# Patient Record
Sex: Female | Born: 1950 | Race: Black or African American | Hispanic: No | Marital: Single | Smoking: Current every day smoker
Health system: Southern US, Community
[De-identification: ages and names within clinical notes are randomized; demographics above are authoritative.]

## PROBLEM LIST (undated history)

## (undated) DIAGNOSIS — J302 Other seasonal allergic rhinitis: Secondary | ICD-10-CM

## (undated) DIAGNOSIS — E785 Hyperlipidemia, unspecified: Secondary | ICD-10-CM

## (undated) DIAGNOSIS — F329 Major depressive disorder, single episode, unspecified: Secondary | ICD-10-CM

## (undated) DIAGNOSIS — F419 Anxiety disorder, unspecified: Secondary | ICD-10-CM

## (undated) DIAGNOSIS — T7840XA Allergy, unspecified, initial encounter: Secondary | ICD-10-CM

## (undated) DIAGNOSIS — F32A Depression, unspecified: Secondary | ICD-10-CM

## (undated) DIAGNOSIS — R51 Headache: Secondary | ICD-10-CM

## (undated) HISTORY — DX: Allergy, unspecified, initial encounter: T78.40XA

## (undated) HISTORY — DX: Hyperlipidemia, unspecified: E78.5

## (undated) HISTORY — PX: TOOTH EXTRACTION: SUR596

---

## 1997-09-17 HISTORY — PX: FOOT FRACTURE SURGERY: SHX645

## 1998-04-26 ENCOUNTER — Emergency Department (HOSPITAL_COMMUNITY): Admission: EM | Admit: 1998-04-26 | Discharge: 1998-04-26 | Payer: Self-pay | Admitting: Emergency Medicine

## 2000-04-04 ENCOUNTER — Emergency Department (HOSPITAL_COMMUNITY): Admission: EM | Admit: 2000-04-04 | Discharge: 2000-04-04 | Payer: Self-pay | Admitting: Emergency Medicine

## 2001-01-24 ENCOUNTER — Emergency Department (HOSPITAL_COMMUNITY): Admission: AC | Admit: 2001-01-24 | Discharge: 2001-01-25 | Payer: Self-pay

## 2001-01-24 ENCOUNTER — Encounter: Payer: Self-pay | Admitting: Emergency Medicine

## 2001-01-25 ENCOUNTER — Encounter: Payer: Self-pay | Admitting: Emergency Medicine

## 2001-02-05 ENCOUNTER — Encounter: Payer: Self-pay | Admitting: Sports Medicine

## 2001-02-05 ENCOUNTER — Ambulatory Visit (HOSPITAL_COMMUNITY): Admission: RE | Admit: 2001-02-05 | Discharge: 2001-02-05 | Payer: Self-pay | Admitting: Sports Medicine

## 2001-02-11 ENCOUNTER — Emergency Department (HOSPITAL_COMMUNITY): Admission: EM | Admit: 2001-02-11 | Discharge: 2001-02-11 | Payer: Self-pay | Admitting: Emergency Medicine

## 2001-02-19 ENCOUNTER — Ambulatory Visit (HOSPITAL_BASED_OUTPATIENT_CLINIC_OR_DEPARTMENT_OTHER): Admission: RE | Admit: 2001-02-19 | Discharge: 2001-02-19 | Payer: Self-pay | Admitting: Orthopedic Surgery

## 2002-04-30 ENCOUNTER — Encounter: Payer: Self-pay | Admitting: Emergency Medicine

## 2002-04-30 ENCOUNTER — Emergency Department (HOSPITAL_COMMUNITY): Admission: EM | Admit: 2002-04-30 | Discharge: 2002-04-30 | Payer: Self-pay | Admitting: Unknown Physician Specialty

## 2002-06-06 ENCOUNTER — Emergency Department (HOSPITAL_COMMUNITY): Admission: EM | Admit: 2002-06-06 | Discharge: 2002-06-06 | Payer: Self-pay | Admitting: Emergency Medicine

## 2003-07-09 ENCOUNTER — Emergency Department (HOSPITAL_COMMUNITY): Admission: EM | Admit: 2003-07-09 | Discharge: 2003-07-09 | Payer: Self-pay | Admitting: Emergency Medicine

## 2008-01-06 ENCOUNTER — Ambulatory Visit: Payer: Self-pay | Admitting: Internal Medicine

## 2008-01-06 ENCOUNTER — Encounter (INDEPENDENT_AMBULATORY_CARE_PROVIDER_SITE_OTHER): Payer: Self-pay | Admitting: Family Medicine

## 2008-01-06 LAB — CONVERTED CEMR LAB
ALT: 11 units/L (ref 0–35)
AST: 17 units/L (ref 0–37)
Albumin: 5.1 g/dL (ref 3.5–5.2)
Alkaline Phosphatase: 97 units/L (ref 39–117)
BUN: 10 mg/dL (ref 6–23)
Basophils Absolute: 0 10*3/uL (ref 0.0–0.1)
Basophils Relative: 0 % (ref 0–1)
CO2: 26 meq/L (ref 19–32)
Calcium: 10.7 mg/dL — ABNORMAL HIGH (ref 8.4–10.5)
Chloride: 102 meq/L (ref 96–112)
Cholesterol: 266 mg/dL — ABNORMAL HIGH (ref 0–200)
Creatinine, Ser: 0.6 mg/dL (ref 0.40–1.20)
Eosinophils Absolute: 0.2 10*3/uL (ref 0.0–0.7)
Eosinophils Relative: 2 % (ref 0–5)
Glucose, Bld: 90 mg/dL (ref 70–99)
HCT: 40.1 % (ref 36.0–46.0)
HDL: 107 mg/dL (ref >39)
Hemoglobin: 13.2 g/dL (ref 12.0–15.0)
LDL Cholesterol: 124 mg/dL — ABNORMAL HIGH (ref 0–99)
Lymphocytes Relative: 37 % (ref 12–46)
Lymphs Abs: 3.7 10*3/uL (ref 0.7–4.0)
MCHC: 32.9 g/dL (ref 30.0–36.0)
MCV: 87.9 fL (ref 78.0–100.0)
Monocytes Absolute: 0.6 10*3/uL (ref 0.1–1.0)
Monocytes Relative: 6 % (ref 3–12)
Neutro Abs: 5.5 10*3/uL (ref 1.7–7.7)
Neutrophils Relative %: 55 % (ref 43–77)
Platelets: 385 10*3/uL (ref 150–400)
Potassium: 5.1 meq/L (ref 3.5–5.3)
RBC: 4.56 M/uL (ref 3.87–5.11)
RDW: 14.3 % (ref 11.5–15.5)
Sodium: 140 meq/L (ref 135–145)
TSH: 1.849 microintl units/mL (ref 0.350–5.50)
Total Bilirubin: 0.4 mg/dL (ref 0.3–1.2)
Total CHOL/HDL Ratio: 2.5
Total Protein: 8.5 g/dL — ABNORMAL HIGH (ref 6.0–8.3)
Triglycerides: 174 mg/dL — ABNORMAL HIGH (ref <150)
VLDL: 35 mg/dL (ref 0–40)
WBC: 9.9 10*3/uL (ref 4.0–10.5)

## 2008-01-08 ENCOUNTER — Ambulatory Visit: Payer: Self-pay | Admitting: *Deleted

## 2008-04-02 ENCOUNTER — Encounter (INDEPENDENT_AMBULATORY_CARE_PROVIDER_SITE_OTHER): Payer: Self-pay | Admitting: Family Medicine

## 2008-04-02 ENCOUNTER — Ambulatory Visit: Payer: Self-pay | Admitting: Internal Medicine

## 2008-04-02 LAB — CONVERTED CEMR LAB
Chlamydia, DNA Probe: NEGATIVE
GC Probe Amp, Genital: NEGATIVE

## 2008-09-13 ENCOUNTER — Ambulatory Visit: Payer: Self-pay | Admitting: Family Medicine

## 2011-02-02 NOTE — Op Note (Signed)
[place]. [facility]  Patient:    Maria Friedman, Maria Friedman                        MRN: [id number] Proc. Date: 02/19/01 Adm. Date:  [id number] Attending:  Colbert Ewing                           Operative Report  PREOPERATIVE DIAGNOSIS:  Lisfranc fracture dislocation with diastasis, first and second cuneiform, right foot.  Impact fracture navicular at navicular first cuneiform joint.  POSTOPERATIVE DIAGNOSIS:  Lisfranc fracture dislocation with diastasis, first and second cuneiform, right foot.  Impact fracture navicular at navicular first cuneiform joint.  OPERATION PERFORMED:  Attempted closed reduction followed by open reduction internal fixation of Lisfranc fracture dislocation with interfragmentary screws first and second cuneiform x2.  SURGEON:  Loreta Ave, M.D.  ASSISTANT:  Arlys John D. Petrarca, P.A.-C.  ANESTHESIA:  General.  ESTIMATED BLOOD LOSS:  Minimal.  SPECIMENS:  None.  CULTURES:  None.  COMPLICATIONS:  None.  DRESSING:  Soft compressive with cam walker.  DESCRIPTION OF PROCEDURE:  The patient was brought to the operating room and placed on the operating table in supine position.  After adequate anesthesia had been obtained, we used fluoroscopy and manipulation to see if we could reduce her Lisfranc divergent fracture dislocation.  Given the chronicity of the injury, this was not able to be reduced to a good anatomic position closed.  Tourniquet applied, prepped and draped in the usual sterile fashion. Exsanguinated with elevation and Esmarch.  Tourniquet inflated to 250 mmHg. Longitudinal incision over the first second cuneiform interval dorsal aspect of the foot.  Skin and subcutaneous tissues divided.  Sensory nerve and extensor tendon protected and retracted.  area of injury identified.  Debris cleared out between the first and second cuneiform, first and second metatarsal shafts and also at the cuneiform navicular joint.   Once this was all cleared, I was able to reduce the first metatarsal, first cuneiform unit back up into anatomic position, bringing it out of the impaction on the navicular and being able to bring it over against the second cuneiform locking the second metatarsal into a good keyed position.  Alignment was confirmed anatomic by visual and fluoroscopic means.  A small longitudinal incision was made on the medial aspect first cuneiform.  The small guide wires for the 4.0 lag screws were then used to  ____________ the first cuneiform into the second cuneiform.  I predrilled, measured and then securely fixed.  I had to use washers on the screws so they would not penetrate the medial border of the first cuneiform.  Once these had been placed and had been countersunk down into place along the medial border first cuneiform with the threads in the second cuneiform, it locked the entire injury into a good position.  This restored normal anatomic relation of the navicular first cuneiform joint as well as the first and second cuneiform joint.  Overall alignment was excellent with good solid stable fixation.  This was confirmed fluoroscopically. Wound irrigated, injected with Marcaine without epinephrine and then closed with nylon.  Sterile compressive dressing and cam walker applied after tourniquet inflated.  Anesthesia reversed.  Brought to recovery room.  Tolerated surgery well.  No complications. DD:  02/20/01 TD:  02/20/01 Job: [id number]

## 2012-06-30 ENCOUNTER — Other Ambulatory Visit: Payer: Self-pay | Admitting: Internal Medicine

## 2012-06-30 DIAGNOSIS — Z1231 Encounter for screening mammogram for malignant neoplasm of breast: Secondary | ICD-10-CM

## 2012-07-08 ENCOUNTER — Other Ambulatory Visit: Payer: Self-pay | Admitting: Internal Medicine

## 2012-07-08 DIAGNOSIS — M810 Age-related osteoporosis without current pathological fracture: Secondary | ICD-10-CM

## 2012-07-25 ENCOUNTER — Ambulatory Visit: Payer: Self-pay

## 2012-11-28 ENCOUNTER — Encounter (HOSPITAL_COMMUNITY): Payer: Self-pay | Admitting: Emergency Medicine

## 2012-11-28 ENCOUNTER — Emergency Department (HOSPITAL_COMMUNITY)
Admission: EM | Admit: 2012-11-28 | Discharge: 2012-11-28 | Disposition: A | Discharge status: Home / Self Care | Payer: Medicaid Other | Source: Home / Self Care | Attending: Emergency Medicine | Admitting: Emergency Medicine

## 2012-11-28 DIAGNOSIS — J342 Deviated nasal septum: Secondary | ICD-10-CM

## 2012-11-28 DIAGNOSIS — R51 Headache: Secondary | ICD-10-CM

## 2012-11-28 DIAGNOSIS — J309 Allergic rhinitis, unspecified: Secondary | ICD-10-CM

## 2012-11-28 HISTORY — DX: Other seasonal allergic rhinitis: J30.2

## 2012-11-28 MED ORDER — FEXOFENADINE-PSEUDOEPHED ER 60-120 MG PO TB12: 1.0000 | ORAL_TABLET | Freq: Two times a day (BID) | ORAL | Status: DC

## 2012-11-28 MED ORDER — NAPROXEN 500 MG PO TABS: 500.0000 mg | ORAL_TABLET | Freq: Two times a day (BID) | ORAL | Status: DC

## 2012-11-28 MED ORDER — FLUTICASONE PROPIONATE 50 MCG/ACT NA SUSP: 2.0000 | Freq: Every day | NASAL | Status: DC

## 2012-11-28 MED ORDER — GABAPENTIN 300 MG PO CAPS: ORAL_CAPSULE | ORAL | Status: DC

## 2012-11-28 NOTE — ED Notes (Signed)
C/o headache pain behind her R eye, R temple and R ear onset last Wednesday for 3-4 hrs.  Pain came back today while talking to her daughter @ 1330.  When she has a disagreement with someone it triggers a headache.  No nausea

## 2012-11-28 NOTE — ED Provider Notes (Signed)
Chief Complaint  Patient presents with  . Headache    History of Present Illness:   Maria Friedman is a 62 year old female who presents with a 3-4 year history of recurring headaches. These occur about once a week and lasts from 3 or 4 hours at a time. They're often precipitated by her getting upset. They involve the entire right side of the face and it feels like her face is about to explode. The pain is rated 10 over 10 in intensity. It's not been associated with nausea, vomiting, photophobia, or phonophobia. The pain is equally worse if she is active or lying down. She denies any diplopia, blurry vision, redness or watering of the eye. She has felt chilled at times but denies any fever or stiff neck. She has had chronic nasal congestion, rhinorrhea, sneezing and has a deviation of her nose. She denies any purulent drainage. She's had no neurological symptoms such as paresthesias, muscle weakness, difficulty with speech, swallowing, coordination, balance, or ambulation.  Review of Systems:  Other than noted above, the patient denies any of the following symptoms: Systemic:  No fever, chills, fatigue, photophobia, stiff neck. Eye:  No redness, eye pain, discharge, blurred vision, or diplopia. ENT:  No nasal congestion, rhinorrhea, sinus pressure or pain, sneezing, earache, or sore throat.  No jaw claudication. Neuro:  No paresthesias, loss of consciousness, seizure activity, muscle weakness, trouble with coordination or gait, trouble speaking or swallowing. Psych:  No depression, anxiety or trouble sleeping.  PMFSH:  Past medical history, family history, social history, meds, and allergies were reviewed.  Physical Exam:   Vital signs:  BP 152/89  Pulse 86  Temp(Src) 98.4 F (36.9 C) (Oral)  Resp 20  SpO2 98% General:  Alert and oriented.  In no distress. Eye:  Lids and conjunctivas normal.  PERRL,  Full EOMs.  Fundi benign with normal discs and vessels. She has bilateral cataracts. ENT:  No  cranial or facial tenderness to palpation.  TMs and canals clear.  Nasal mucosa was normal and uncongested without any drainage. No intra oral lesions, pharynx clear, mucous membranes moist, dentition normal. Her nose appear markedly crooked with a deviation to the left. She has a deviated nasal septum. Neck:  Supple, full ROM, no tenderness to palpation.  No adenopathy or mass. Neuro:  Alert and orented times 3.  Speech was clear, fluent, and appropriate.  Cranial nerves intact. No pronator drift, muscle strength normal. Finger to nose normal.  DTRs were 2+ and symmetrical.Station and gait were normal.  Romberg's sign was normal.  Able to perform tandem gait well. Psych:  Normal affect.  Assessment:  The primary encounter diagnosis was Headache. Diagnoses of Allergic rhinitis and Deviated nasal septum were also pertinent to this visit.  She has chronic recurring hemicranial pain that does not fit the pattern of a migraine headache. I suspect this is a neuritis or neuropathy, and she was given gabapentin and Naprosyn for the pain. I suggested she followup with a neurologist. She also has a deviated nasal septum and cataracts. She was interested in getting these taken care of as well, so she was given the names of some specialist for followup.  Plan:   1.  The following meds were prescribed:   Discharge Medication List as of 11/28/2012  7:29 PM    START taking these medications   Details  fexofenadine-pseudoephedrine (ALLEGRA-D) 60-120 MG per tablet Take 1 tablet by mouth every 12 (twelve) hours., Starting 11/28/2012, Until Discontinued, Normal  fluticasone (FLONASE) 50 MCG/ACT nasal spray Place 2 sprays into the nose daily., Starting 11/28/2012, Until Discontinued, Normal    gabapentin (NEURONTIN) 300 MG capsule 1 daily for 3 days, 1 BID for 3 days, then 1 TID, Normal    naproxen (NAPROSYN) 500 MG tablet Take 1 tablet (500 mg total) by mouth 2 (two) times daily., Starting 11/28/2012, Until  Discontinued, Normal       2.  The patient was instructed in symptomatic care and handouts were given. 3.  The patient was told to return if becoming worse in any way, if no better in 3 or 4 days, and given some red flag symptoms that would indicate earlier return.  Follow up:  The patient was told to follow up with Dr. Porfirio Mylar Dohmeier for the headaches , Dr. Charlotte Sanes for the cataracts, and Dr. Pollyann Kennedy for the deviated nasal septum and allergic rhinitis.     Reuben Likes, MD 11/28/12 2137

## 2012-12-08 ENCOUNTER — Other Ambulatory Visit: Payer: Self-pay | Admitting: Ophthalmology

## 2012-12-08 DIAGNOSIS — G909 Disorder of the autonomic nervous system, unspecified: Secondary | ICD-10-CM

## 2012-12-11 ENCOUNTER — Ambulatory Visit
Admission: RE | Admit: 2012-12-11 | Discharge: 2012-12-11 | Disposition: A | Discharge status: Home / Self Care | Payer: Medicaid Other | Source: Ambulatory Visit | Attending: Ophthalmology | Admitting: Ophthalmology

## 2012-12-11 DIAGNOSIS — G909 Disorder of the autonomic nervous system, unspecified: Secondary | ICD-10-CM

## 2012-12-11 DIAGNOSIS — R519 Headache, unspecified: Secondary | ICD-10-CM

## 2012-12-11 MED ORDER — IOHEXOL 350 MG/ML SOLN
100.0000 mL | Freq: Once | INTRAVENOUS | Status: AC | PRN
  Administered 2012-12-11: 100 mL via INTRAVENOUS

## 2012-12-12 ENCOUNTER — Telehealth: Payer: Self-pay | Admitting: *Deleted

## 2012-12-13 NOTE — Telephone Encounter (Signed)
Have left 2 messages on pt's home vm regarding appointment cancellation on 12-18-12 with a request and instructions to call back so that the appt may be rescheduled.

## 2012-12-18 ENCOUNTER — Ambulatory Visit: Payer: Self-pay | Admitting: Neurology

## 2012-12-23 ENCOUNTER — Other Ambulatory Visit (HOSPITAL_COMMUNITY): Payer: Self-pay | Admitting: Ophthalmology

## 2012-12-23 DIAGNOSIS — I770 Arteriovenous fistula, acquired: Secondary | ICD-10-CM

## 2012-12-25 ENCOUNTER — Ambulatory Visit (HOSPITAL_COMMUNITY): Payer: Medicaid Other

## 2012-12-25 ENCOUNTER — Ambulatory Visit (HOSPITAL_COMMUNITY)
Admission: RE | Admit: 2012-12-25 | Discharge: 2012-12-25 | Disposition: A | Discharge status: Home / Self Care | Payer: Medicaid Other | Source: Ambulatory Visit | Attending: Ophthalmology | Admitting: Ophthalmology

## 2012-12-25 ENCOUNTER — Ambulatory Visit (HOSPITAL_COMMUNITY)
Admission: RE | Admit: 2012-12-25 | Discharge: 2012-12-25 | Disposition: A | Discharge status: Home / Self Care | Payer: Medicaid Other | Source: Ambulatory Visit | Attending: Interventional Radiology | Admitting: Interventional Radiology

## 2012-12-25 ENCOUNTER — Other Ambulatory Visit (HOSPITAL_COMMUNITY): Payer: Self-pay | Admitting: Interventional Radiology

## 2012-12-25 DIAGNOSIS — R51 Headache: Secondary | ICD-10-CM | POA: Insufficient documentation

## 2012-12-25 DIAGNOSIS — I671 Cerebral aneurysm, nonruptured: Secondary | ICD-10-CM

## 2012-12-25 DIAGNOSIS — I770 Arteriovenous fistula, acquired: Secondary | ICD-10-CM | POA: Insufficient documentation

## 2012-12-25 DIAGNOSIS — R928 Other abnormal and inconclusive findings on diagnostic imaging of breast: Secondary | ICD-10-CM | POA: Insufficient documentation

## 2012-12-25 MED ORDER — ALPRAZOLAM 0.25 MG PO TABS
0.2500 mg | ORAL_TABLET | Freq: Once | ORAL | Status: AC
  Administered 2012-12-25: 0.25 mg via ORAL

## 2012-12-25 MED ORDER — GADOBENATE DIMEGLUMINE 529 MG/ML IV SOLN
11.0000 mL | Freq: Once | INTRAVENOUS | Status: AC | PRN
  Administered 2012-12-25: 11 mL via INTRAVENOUS

## 2012-12-25 MED ORDER — ALPRAZOLAM 0.25 MG PO TABS
ORAL_TABLET | ORAL | Status: AC
  Filled 2012-12-25: qty 1

## 2012-12-29 ENCOUNTER — Other Ambulatory Visit (HOSPITAL_COMMUNITY): Payer: Self-pay | Admitting: Interventional Radiology

## 2012-12-29 DIAGNOSIS — I671 Cerebral aneurysm, nonruptured: Secondary | ICD-10-CM

## 2012-12-30 ENCOUNTER — Encounter (HOSPITAL_COMMUNITY): Payer: Self-pay | Admitting: Pharmacy Technician

## 2012-12-30 ENCOUNTER — Ambulatory Visit: Payer: Self-pay | Admitting: Neurology

## 2012-12-31 ENCOUNTER — Ambulatory Visit (HOSPITAL_COMMUNITY): Admission: RE | Admit: 2012-12-31 | Payer: Medicaid Other | Source: Ambulatory Visit

## 2012-12-31 ENCOUNTER — Other Ambulatory Visit: Payer: Self-pay | Admitting: Radiology

## 2013-01-05 ENCOUNTER — Encounter: Payer: Self-pay | Admitting: *Deleted

## 2013-01-06 ENCOUNTER — Encounter (HOSPITAL_COMMUNITY): Payer: Self-pay

## 2013-01-06 ENCOUNTER — Encounter (HOSPITAL_COMMUNITY)
Admission: RE | Admit: 2013-01-06 | Discharge: 2013-01-06 | Disposition: A | Discharge status: Home / Self Care | Payer: Medicaid Other | Source: Ambulatory Visit | Attending: Interventional Radiology | Admitting: Interventional Radiology

## 2013-01-06 ENCOUNTER — Ambulatory Visit: Payer: Self-pay | Admitting: Neurology

## 2013-01-06 LAB — CBC WITH DIFFERENTIAL/PLATELET
Basophils Absolute: 0 10*3/uL (ref 0.0–0.1)
Basophils Relative: 0 % (ref 0–1)
HCT: 38.2 % (ref 36.0–46.0)
Lymphocytes Relative: 39 % (ref 12–46)
MCHC: 34 g/dL (ref 30.0–36.0)
Monocytes Absolute: 0.5 10*3/uL (ref 0.1–1.0)
Neutro Abs: 4.6 10*3/uL (ref 1.7–7.7)
Neutrophils Relative %: 54 % (ref 43–77)
Platelets: 328 10*3/uL (ref 150–400)
RDW: 14.5 % (ref 11.5–15.5)
WBC: 8.5 10*3/uL (ref 4.0–10.5)

## 2013-01-06 LAB — COMPREHENSIVE METABOLIC PANEL
ALT: 20 U/L (ref 0–35)
AST: 27 U/L (ref 0–37)
Albumin: 4.9 g/dL (ref 3.5–5.2)
Alkaline Phosphatase: 108 U/L (ref 39–117)
CO2: 26 mEq/L (ref 19–32)
Chloride: 102 mEq/L (ref 96–112)
GFR calc non Af Amer: >90 mL/min (ref >90)
Potassium: 4.2 mEq/L (ref 3.5–5.1)
Sodium: 140 mEq/L (ref 135–145)
Total Bilirubin: 0.3 mg/dL (ref 0.3–1.2)

## 2013-01-06 LAB — APTT: aPTT: 33 seconds (ref 24–37)

## 2013-01-06 LAB — PROTIME-INR: INR: 0.98 (ref 0.00–1.49)

## 2013-01-06 NOTE — Pre-Procedure Instructions (Signed)
Maria Friedman  01/06/2013   Your procedure is scheduled on:  01/08/2013  Report to [facility] at 6:00 AM.  Call this number if you have problems the morning of surgery: [phone]   Remember:   Do not eat food or drink liquids after midnight. WEDNESDAY   Take these medicines the morning of surgery with A SIP OF WATER: nasal spray is ok if needed, gabapentin    Do not wear jewelry, make-up or nail polish.  Do not wear lotions, powders, or perfumes. You may wear deodorant.  Do not shave 48 hours prior to surgery.   Do not bring valuables to the hospital.  Contacts, dentures or bridgework may not be worn into surgery.  Leave suitcase in the car. After surgery it may be brought to your room.  For patients admitted to the hospital, checkout time is 11:00 AM the day of  discharge.   Patients discharged the day of surgery will not be allowed to drive  home.  Name and phone number of your driver: /w daughter   Special Instructions: Shower using CHG 2 nights before surgery and the night before surgery.  If you shower the day of surgery use CHG.  Use special wash - you have one bottle of CHG for all showers.  You should use approximately 1/3 of the bottle for each shower.   Please read over the following fact sheets that you were given: Pain Booklet, Coughing and Deep Breathing, MRSA Information and Surgical Site Infection Prevention

## 2013-01-08 ENCOUNTER — Encounter (HOSPITAL_COMMUNITY): Payer: Self-pay | Admitting: Anesthesiology

## 2013-01-08 ENCOUNTER — Encounter (HOSPITAL_COMMUNITY)
Admission: RE | Disposition: A | Discharge status: Home / Self Care | Payer: Self-pay | Source: Ambulatory Visit | Attending: Interventional Radiology

## 2013-01-08 ENCOUNTER — Ambulatory Visit (HOSPITAL_COMMUNITY)
Admission: RE | Admit: 2013-01-08 | Discharge: 2013-01-08 | Disposition: A | Discharge status: Home / Self Care | Payer: Medicaid Other | Source: Ambulatory Visit | Attending: Interventional Radiology | Admitting: Interventional Radiology

## 2013-01-08 ENCOUNTER — Ambulatory Visit (HOSPITAL_COMMUNITY): Payer: Medicaid Other | Admitting: Anesthesiology

## 2013-01-08 ENCOUNTER — Encounter (HOSPITAL_COMMUNITY): Payer: Self-pay

## 2013-01-08 ENCOUNTER — Encounter (HOSPITAL_COMMUNITY): Payer: Self-pay | Admitting: *Deleted

## 2013-01-08 VITALS — BP 155/69 | HR 59 | Resp 18

## 2013-01-08 DIAGNOSIS — R51 Headache: Secondary | ICD-10-CM | POA: Insufficient documentation

## 2013-01-08 DIAGNOSIS — I671 Cerebral aneurysm, nonruptured: Secondary | ICD-10-CM

## 2013-01-08 DIAGNOSIS — F17200 Nicotine dependence, unspecified, uncomplicated: Secondary | ICD-10-CM | POA: Insufficient documentation

## 2013-01-08 DIAGNOSIS — Q283 Other malformations of cerebral vessels: Secondary | ICD-10-CM | POA: Insufficient documentation

## 2013-01-08 DIAGNOSIS — F411 Generalized anxiety disorder: Secondary | ICD-10-CM | POA: Insufficient documentation

## 2013-01-08 DIAGNOSIS — F329 Major depressive disorder, single episode, unspecified: Secondary | ICD-10-CM | POA: Insufficient documentation

## 2013-01-08 DIAGNOSIS — H02409 Unspecified ptosis of unspecified eyelid: Secondary | ICD-10-CM | POA: Insufficient documentation

## 2013-01-08 DIAGNOSIS — F3289 Other specified depressive episodes: Secondary | ICD-10-CM | POA: Insufficient documentation

## 2013-01-08 HISTORY — DX: Anxiety disorder, unspecified: F41.9

## 2013-01-08 HISTORY — DX: Depression, unspecified: F32.A

## 2013-01-08 HISTORY — PX: RADIOLOGY WITH ANESTHESIA: SHX6223

## 2013-01-08 HISTORY — DX: Headache: R51

## 2013-01-08 HISTORY — DX: Major depressive disorder, single episode, unspecified: F32.9

## 2013-01-08 LAB — POCT ACTIVATED CLOTTING TIME: Activated Clotting Time: 187 s

## 2013-01-08 SURGERY — RADIOLOGY WITH ANESTHESIA
Anesthesia: General

## 2013-01-08 MED ORDER — NITROGLYCERIN 1 MG/10 ML FOR IR/CATH LAB
INTRA_ARTERIAL | Status: AC
  Filled 2013-01-08: qty 10

## 2013-01-08 MED ORDER — SODIUM CHLORIDE 0.9 % IV SOLN
Freq: Once | INTRAVENOUS | Status: AC
  Administered 2013-01-08: 12:00:00 via INTRAVENOUS

## 2013-01-08 MED ORDER — FENTANYL CITRATE 0.05 MG/ML IJ SOLN
INTRAMUSCULAR | Status: DC | PRN
  Administered 2013-01-08 (×2): 50 ug via INTRAVENOUS

## 2013-01-08 MED ORDER — NIMODIPINE 30 MG PO CAPS
ORAL_CAPSULE | ORAL | Status: AC
  Administered 2013-01-08: 60 mg via ORAL
  Filled 2013-01-08: qty 2

## 2013-01-08 MED ORDER — NIMODIPINE 30 MG PO CAPS
60.0000 mg | ORAL_CAPSULE | ORAL | Status: AC
  Filled 2013-01-08: qty 2

## 2013-01-08 MED ORDER — HEPARIN SODIUM (PORCINE) 1000 UNIT/ML IJ SOLN
INTRAMUSCULAR | Status: DC | PRN
  Administered 2013-01-08: 1000 [IU] via INTRAVENOUS
  Administered 2013-01-08: 500 [IU] via INTRAVENOUS

## 2013-01-08 MED ORDER — SODIUM CHLORIDE 0.9 % IV SOLN: INTRAVENOUS | Status: AC

## 2013-01-08 MED ORDER — MIDAZOLAM HCL 5 MG/5ML IJ SOLN
INTRAMUSCULAR | Status: DC | PRN
  Administered 2013-01-08: 2 mg via INTRAVENOUS

## 2013-01-08 MED ORDER — IOHEXOL 300 MG/ML  SOLN
150.0000 mL | Freq: Once | INTRAMUSCULAR | Status: AC | PRN
  Administered 2013-01-08: 250 mL via INTRA_ARTERIAL

## 2013-01-08 MED ORDER — PROPOFOL 10 MG/ML IV BOLUS
INTRAVENOUS | Status: DC | PRN
  Administered 2013-01-08: 20 mg via INTRAVENOUS

## 2013-01-08 MED ORDER — CEFAZOLIN SODIUM-DEXTROSE 2-3 GM-% IV SOLR
INTRAVENOUS | Status: AC
  Administered 2013-01-08: 2 g via INTRAVENOUS
  Filled 2013-01-08: qty 50

## 2013-01-08 MED ORDER — LIDOCAINE HCL (CARDIAC) 20 MG/ML IV SOLN
INTRAVENOUS | Status: DC | PRN
  Administered 2013-01-08: 80 mg via INTRAVENOUS

## 2013-01-08 MED ORDER — LACTATED RINGERS IV SOLN
INTRAVENOUS | Status: DC | PRN
  Administered 2013-01-08: 07:00:00 via INTRAVENOUS

## 2013-01-08 MED ORDER — PHENYLEPHRINE HCL 10 MG/ML IJ SOLN
INTRAMUSCULAR | Status: DC | PRN
  Administered 2013-01-08: 40 ug via INTRAVENOUS

## 2013-01-08 MED ORDER — CEFAZOLIN SODIUM-DEXTROSE 2-3 GM-% IV SOLR
2.0000 g | Freq: Once | INTRAVENOUS | Status: DC
  Filled 2013-01-08: qty 50

## 2013-01-08 MED ORDER — PROTAMINE SULFATE 10 MG/ML IV SOLN
INTRAVENOUS | Status: DC | PRN
  Administered 2013-01-08: 5 mg via INTRAVENOUS

## 2013-01-08 NOTE — Anesthesia Postprocedure Evaluation (Signed)
  Anesthesia Post-op Note  Patient: Maria Friedman  Procedure(s) Performed: Procedure(s): RADIOLOGY WITH ANESTHESIA: CEREBRAL ANGIOGRAM (N/A)  Patient Location: Short Stay  Anesthesia Type:MAC  Level of Consciousness: awake and alert   Airway and Oxygen Therapy: Patient Spontanous Breathing  Post-op Pain: none  Post-op Assessment: Post-op Vital signs reviewed, Patient's Cardiovascular Status Stable, Respiratory Function Stable, Patent Airway and No signs of Nausea or vomiting  Post-op Vital Signs: Reviewed and stable  Complications: No apparent anesthesia complications

## 2013-01-08 NOTE — Procedures (Signed)
S/P bilateral carotid and bilateral vertebral angiograms ,and total spinal arteriogram . Rt CFA approach . Findings . 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

## 2013-01-08 NOTE — Preoperative (Signed)
Beta Blockers   Reason not to administer Beta Blockers:Not Applicable 

## 2013-01-08 NOTE — Transfer of Care (Signed)
Immediate Anesthesia Transfer of Care Note  Patient: Maria Friedman  Procedure(s) Performed: Procedure(s): RADIOLOGY WITH ANESTHESIA: CEREBRAL ANGIOGRAM (N/A)  Patient Location: Short Stay  Anesthesia Type:MAC  Level of Consciousness: awake, alert  and oriented  Airway & Oxygen Therapy: Patient Spontanous Breathing  Post-op Assessment: Report given to PACU RN, Post -op Vital signs reviewed and stable and Patient moving all extremities  Post vital signs: Reviewed and stable  Complications: No apparent anesthesia complications

## 2013-01-08 NOTE — Progress Notes (Signed)
Transferred via stretcher to B8 report to [facility] RN groin site dressing c/d/i.

## 2013-01-08 NOTE — H&P (Signed)
Maria Friedman is an 62 y.o. female.   Chief Complaint: Rt sided eye and neck pain; rt eye lid ptosis x yrs Recent worsening of symptoms CTA reveals findings of prominent Rt superior ophthalmic vein; prominent anterior vessel in upper cervical cord which may represent prominent draining vein. 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  HPI: smoker; headaches  Past Medical History  Diagnosis Date  . Seasonal allergies   . Depression   . Anxiety   . Headache     Past Surgical History  Procedure Laterality Date  . Foot fracture surgery  1999    2 screws in foot , R  . Vaginal delivery      x3   . Tooth extraction      total extractions, set up for dentures     Family History  Problem Relation Age of Onset  . COPD Mother   . Diabetes Other   . Thyroid disease Other    Social History:  reports that she has been smoking Cigarettes.  She has been smoking about 0.50 packs per day. She does not have any smokeless tobacco history on file. She reports that  drinks alcohol. She reports that she uses illicit drugs (Marijuana) about twice per week.  Allergies: No Known Allergies   (Not in a hospital admission)  Results for orders placed during the hospital encounter of 01/08/13 (from the past 48 hour(s))  APTT     Status: None   Collection Time    01/06/13 12:40 PM      Result Value Range   aPTT 33  24 - 37 seconds  CBC WITH DIFFERENTIAL     Status: None   Collection Time    01/06/13 12:40 PM      Result Value Range   WBC 8.5  4.0 - 10.5 K/uL   RBC 4.54  3.87 - 5.11 MIL/uL   Hemoglobin 13.0  12.0 - 15.0 g/dL   HCT 81.1  91.4 - 78.2 %   MCV 84.1  78.0 - 100.0 fL   MCH 28.6  26.0 - 34.0 pg   MCHC 34.0  30.0 - 36.0 g/dL   RDW 95.6  21.3 - 08.6 %   Platelets 328  150 - 400 K/uL   Neutrophils Relative 54  43 - 77 %   Neutro Abs 4.6  1.7 - 7.7 K/uL   Lymphocytes Relative 39  12 - 46 %   Lymphs  Abs 3.3  0.7 - 4.0 K/uL   Monocytes Relative 6  3 - 12 %   Monocytes Absolute 0.5  0.1 - 1.0 K/uL   Eosinophils Relative 1  0 - 5 %   Eosinophils Absolute 0.1  0.0 - 0.7 K/uL   Basophils Relative 0  0 - 1 %   Basophils Absolute 0.0  0.0 - 0.1 K/uL  COMPREHENSIVE METABOLIC PANEL     Status: Abnormal   Collection Time    01/06/13 12:40 PM      Result Value Range   Sodium 140  135 - 145 mEq/L   Potassium 4.2  3.5 - 5.1 mEq/L   Chloride 102  96 - 112 mEq/L   CO2 26  19 - 32 mEq/L   Glucose, Bld 80  70 - 99 mg/dL   BUN 9  6 - 23 mg/dL   Creatinine, Ser 5.78  0.50 - 1.10 mg/dL   Calcium 46.9  8.4 - 62.9 mg/dL  Total Protein 8.4 (*) 6.0 - 8.3 g/dL   Albumin 4.9  3.5 - 5.2 g/dL   AST 27  0 - 37 U/L   ALT 20  0 - 35 U/L   Alkaline Phosphatase 108  39 - 117 U/L   Total Bilirubin 0.3  0.3 - 1.2 mg/dL   GFR calc non Af Amer >90  >90 mL/min   GFR calc Af Amer >90  >90 mL/min   Comment:            The eGFR has been calculated     using the CKD EPI equation.     This calculation has not been     validated in all clinical     situations.     eGFR's persistently     <90 mL/min signify     possible Chronic Kidney Disease.  PROTIME-INR     Status: None   Collection Time    01/06/13 12:40 PM      Result Value Range   Prothrombin Time 12.9  11.6 - 15.2 seconds   INR 0.98  0.00 - 1.49   No results found.  Review of Systems  Constitutional: Negative for fever.  HENT: Positive for neck pain.   Eyes: Positive for pain.       Rt periorbital pain  Respiratory: Negative for shortness of breath.   Cardiovascular: Negative for chest pain.  Gastrointestinal: Negative for nausea, vomiting and abdominal pain.  Neurological: Positive for headaches. Negative for dizziness and speech change.  Psychiatric/Behavioral: The patient is nervous/anxious.     There were no vitals taken for this visit. Physical Exam  Constitutional: She is oriented to person, place, and time. She appears  well-developed and well-nourished.  Eyes: EOM are normal.  Rt eye ptosis  Neck: Normal range of motion.  Cardiovascular: Normal rate, regular rhythm and normal heart sounds.   No murmur heard. Respiratory: Effort normal and breath sounds normal. She has no wheezes.  GI: Soft. There is no tenderness.  Musculoskeletal: Normal range of motion.  Neurological: She is alert and oriented to person, place, and time.  Skin: Skin is warm and dry.  Psychiatric: She has a normal mood and affect. Her behavior is normal. Judgment and thought content normal.     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 Pt aware of procedure benefits and risks and agreeable to proceed Consent signed and in chart Pt understands if fistula is found on exam; embolization will be performed. She will be admitted to Neuro ICU overnight.    Marty Sadlowski A 01/08/2013, 7:24 AM

## 2013-01-08 NOTE — Anesthesia Preprocedure Evaluation (Addendum)
Anesthesia Evaluation  Patient identified by MRN, date of birth, ID band Patient awake    Reviewed: Allergy & Precautions, H&P , NPO status , Patient's Chart, lab work & pertinent test results  Airway Mallampati: II TM Distance: >3 FB Neck ROM: Full    Dental no notable dental hx. (+) Edentulous Upper, Edentulous Lower and Dental Advisory Given   Pulmonary neg pulmonary ROS,  breath sounds clear to auscultation  Pulmonary exam normal       Cardiovascular negative cardio ROS  Rhythm:Regular Rate:Normal     Neuro/Psych  Headaches, PSYCHIATRIC DISORDERS    GI/Hepatic negative GI ROS, Neg liver ROS,   Endo/Other  negative endocrine ROS  Renal/GU negative Renal ROS  negative genitourinary   Musculoskeletal   Abdominal   Peds  Hematology negative hematology ROS (+)   Anesthesia Other Findings   Reproductive/Obstetrics negative OB ROS                          Anesthesia Physical Anesthesia Plan  ASA: II  Anesthesia Plan: General   Post-op Pain Management:    Induction: Intravenous  Airway Management Planned: Oral ETT  Additional Equipment:   Intra-op Plan:   Post-operative Plan: Extubation in OR  Informed Consent: I have reviewed the patients History and Physical, chart, labs and discussed the procedure including the risks, benefits and alternatives for the proposed anesthesia with the patient or authorized representative who has indicated his/her understanding and acceptance.   Dental advisory given  Plan Discussed with: CRNA  Anesthesia Plan Comments:         Anesthesia Quick Evaluation

## 2013-01-08 NOTE — Progress Notes (Signed)
Foley catheter removed without difficulty. Pt tolerated well. 400cc clear yellow urine noted. Pt able to void post catheter removal without difficulty.

## 2013-01-09 ENCOUNTER — Other Ambulatory Visit (HOSPITAL_COMMUNITY): Payer: Self-pay | Admitting: Interventional Radiology

## 2013-01-09 ENCOUNTER — Encounter (HOSPITAL_COMMUNITY): Payer: Self-pay | Admitting: Interventional Radiology

## 2013-01-09 DIAGNOSIS — I671 Cerebral aneurysm, nonruptured: Secondary | ICD-10-CM

## 2013-01-12 ENCOUNTER — Ambulatory Visit (HOSPITAL_COMMUNITY)
Admission: RE | Admit: 2013-01-12 | Discharge: 2013-01-12 | Disposition: A | Discharge status: Home / Self Care | Payer: Medicaid Other | Source: Ambulatory Visit | Attending: Interventional Radiology | Admitting: Interventional Radiology

## 2013-01-12 DIAGNOSIS — I671 Cerebral aneurysm, nonruptured: Secondary | ICD-10-CM

## 2013-02-11 ENCOUNTER — Other Ambulatory Visit: Payer: Self-pay | Admitting: Internal Medicine

## 2013-02-11 DIAGNOSIS — E2839 Other primary ovarian failure: Secondary | ICD-10-CM

## 2013-03-16 ENCOUNTER — Ambulatory Visit: Payer: Medicaid Other

## 2013-03-16 ENCOUNTER — Other Ambulatory Visit: Payer: Medicaid Other

## 2013-04-07 ENCOUNTER — Ambulatory Visit
Admission: RE | Admit: 2013-04-07 | Discharge: 2013-04-07 | Disposition: A | Discharge status: Home / Self Care | Payer: Medicaid Other | Source: Ambulatory Visit | Attending: Internal Medicine | Admitting: Internal Medicine

## 2013-04-07 DIAGNOSIS — Z1231 Encounter for screening mammogram for malignant neoplasm of breast: Secondary | ICD-10-CM

## 2013-04-07 DIAGNOSIS — E2839 Other primary ovarian failure: Secondary | ICD-10-CM

## 2013-04-19 ENCOUNTER — Encounter (HOSPITAL_COMMUNITY): Payer: Self-pay

## 2013-04-19 ENCOUNTER — Emergency Department (HOSPITAL_COMMUNITY)
Admission: EM | Admit: 2013-04-19 | Discharge: 2013-04-19 | Disposition: A | Discharge status: Home / Self Care | Payer: Medicaid Other | Source: Home / Self Care

## 2013-04-19 DIAGNOSIS — H6690 Otitis media, unspecified, unspecified ear: Secondary | ICD-10-CM

## 2013-04-19 DIAGNOSIS — H6692 Otitis media, unspecified, left ear: Secondary | ICD-10-CM

## 2013-04-19 MED ORDER — AMOXICILLIN 500 MG PO CAPS: 500.0000 mg | ORAL_CAPSULE | Freq: Three times a day (TID) | ORAL | Status: DC

## 2013-04-19 MED ORDER — ANTIPYRINE-BENZOCAINE 5.4-1.4 % OT SOLN: 3.0000 [drp] | OTIC | Status: DC | PRN

## 2013-04-19 NOTE — ED Provider Notes (Signed)
Juanetta Negash is a 62 y.o. female who presents to Urgent Care today for left ear pain for the last 6 days. Associated with mild nasal congestion . The pain is mild and not associated with any decreased hearing. She denies any injury. She has not tried any medications. No fevers or chills or sick contact .    PMH reviewed.  depression and anxiety  History  Substance Use Topics  . Smoking status: Current Every Day Smoker -- 0.50 packs/day    Types: Cigarettes  . Smokeless tobacco: Not on file  . Alcohol Use: 0.0 oz/week     Comment: 40 oz. per day- Beer only   ROS as above Medications reviewed. No current facility-administered medications for this encounter.   Current Outpatient Prescriptions  Medication Sig Dispense Refill  . diclofenac (VOLTAREN) 75 MG EC tablet Take 75 mg by mouth 2 (two) times daily.      . fluticasone (FLONASE) 50 MCG/ACT nasal spray Place 2 sprays into the nose daily as needed for rhinitis.      Marland Kitchen gabapentin (NEURONTIN) 300 MG capsule Take 300 mg by mouth 3 (three) times daily.      . mirtazapine (REMERON) 45 MG tablet Take 45 mg by mouth at bedtime.      . naproxen (NAPROSYN) 500 MG tablet Take 500 mg by mouth 2 (two) times daily with a meal.       . amoxicillin (AMOXIL) 500 MG capsule Take 1 capsule (500 mg total) by mouth 3 (three) times daily.  21 capsule  0  . antipyrine-benzocaine (AURALGAN) otic solution Place 3 drops into the left ear every 2 (two) hours as needed for pain.  10 mL  1    Exam:  BP 136/81  Pulse 73  Temp(Src) 98.2 F (36.8 C) (Oral)  Resp 18  SpO2 98% Gen: Well NAD HEENT: EOMI,  MMM, left tympanic membrane with effusion and erythema. Right normal. Nontender mastoid  Lungs: CTABL Nl WOB Heart: RRR no MRG Abd: NABS, NT, ND Exts: Non edematous BL  LE, warm and well perfused.   No results found for this or any previous visit (from the past 24 hour(s)). No results found.  Assessment and Plan: 62 y.o. female with  otitis media.  Plan  to treat with amoxicillin and Auralgan drops.  Discussed warning signs or symptoms. Please see discharge instructions. Patient expresses understanding.      Rodolph Bong, MD 04/19/13 2006

## 2013-04-19 NOTE — ED Notes (Signed)
Patient c/o left ear pain x 6 days

## 2013-04-21 ENCOUNTER — Other Ambulatory Visit (HOSPITAL_COMMUNITY): Payer: Self-pay | Admitting: Interventional Radiology

## 2013-05-04 ENCOUNTER — Other Ambulatory Visit (HOSPITAL_COMMUNITY): Payer: Self-pay | Admitting: Interventional Radiology

## 2013-06-11 ENCOUNTER — Other Ambulatory Visit (HOSPITAL_COMMUNITY): Payer: Self-pay | Admitting: Interventional Radiology

## 2013-06-11 DIAGNOSIS — I671 Cerebral aneurysm, nonruptured: Secondary | ICD-10-CM

## 2013-06-18 ENCOUNTER — Other Ambulatory Visit: Payer: Self-pay | Admitting: Radiology

## 2013-06-19 ENCOUNTER — Encounter (HOSPITAL_COMMUNITY): Payer: Self-pay | Admitting: Pharmacy Technician

## 2013-06-22 ENCOUNTER — Telehealth (HOSPITAL_COMMUNITY): Payer: Self-pay | Admitting: Interventional Radiology

## 2013-06-22 NOTE — Telephone Encounter (Signed)
Tried to return pt's call multiple times, phone is not working. JM

## 2013-06-23 ENCOUNTER — Ambulatory Visit (HOSPITAL_COMMUNITY): Admission: RE | Admit: 2013-06-23 | Payer: Medicaid Other | Source: Ambulatory Visit

## 2013-07-02 ENCOUNTER — Other Ambulatory Visit: Payer: Self-pay | Admitting: Radiology

## 2013-07-06 ENCOUNTER — Other Ambulatory Visit: Payer: Self-pay | Admitting: Radiology

## 2013-07-07 ENCOUNTER — Encounter (HOSPITAL_COMMUNITY): Payer: Self-pay

## 2013-07-07 ENCOUNTER — Ambulatory Visit (HOSPITAL_COMMUNITY)
Admission: RE | Admit: 2013-07-07 | Discharge: 2013-07-07 | Disposition: A | Discharge status: Home / Self Care | Payer: Medicaid Other | Source: Ambulatory Visit | Attending: Interventional Radiology | Admitting: Interventional Radiology

## 2013-07-07 ENCOUNTER — Ambulatory Visit (HOSPITAL_COMMUNITY)
Admission: RE | Admit: 2013-07-07 | Discharge: 2013-07-07 | Disposition: A | Discharge status: Home / Self Care | Payer: Medicaid Other | Source: Ambulatory Visit | Attending: Internal Medicine | Admitting: Internal Medicine

## 2013-07-07 ENCOUNTER — Other Ambulatory Visit (HOSPITAL_COMMUNITY): Payer: Self-pay | Admitting: Interventional Radiology

## 2013-07-07 ENCOUNTER — Other Ambulatory Visit (HOSPITAL_COMMUNITY): Payer: Self-pay | Admitting: Internal Medicine

## 2013-07-07 DIAGNOSIS — F17200 Nicotine dependence, unspecified, uncomplicated: Secondary | ICD-10-CM | POA: Insufficient documentation

## 2013-07-07 DIAGNOSIS — Z09 Encounter for follow-up examination after completed treatment for conditions other than malignant neoplasm: Secondary | ICD-10-CM | POA: Insufficient documentation

## 2013-07-07 DIAGNOSIS — Z8679 Personal history of other diseases of the circulatory system: Secondary | ICD-10-CM | POA: Insufficient documentation

## 2013-07-07 DIAGNOSIS — R52 Pain, unspecified: Secondary | ICD-10-CM

## 2013-07-07 DIAGNOSIS — M25519 Pain in unspecified shoulder: Secondary | ICD-10-CM | POA: Insufficient documentation

## 2013-07-07 DIAGNOSIS — Z79899 Other long term (current) drug therapy: Secondary | ICD-10-CM | POA: Insufficient documentation

## 2013-07-07 DIAGNOSIS — I671 Cerebral aneurysm, nonruptured: Secondary | ICD-10-CM

## 2013-07-07 LAB — CBC WITH DIFFERENTIAL/PLATELET
Basophils Relative: 1 % (ref 0–1)
Eosinophils Absolute: 0.2 10*3/uL (ref 0.0–0.7)
Eosinophils Relative: 3 % (ref 0–5)
Hemoglobin: 12.3 g/dL (ref 12.0–15.0)
Lymphs Abs: 1.7 10*3/uL (ref 0.7–4.0)
MCH: 29.3 pg (ref 26.0–34.0)
MCHC: 33.1 g/dL (ref 30.0–36.0)
MCV: 88.6 fL (ref 78.0–100.0)
Monocytes Relative: 7 % (ref 3–12)
Neutrophils Relative %: 63 % (ref 43–77)

## 2013-07-07 LAB — BASIC METABOLIC PANEL
BUN: 10 mg/dL (ref 6–23)
CO2: 27 mEq/L (ref 19–32)
Calcium: 9.6 mg/dL (ref 8.4–10.5)
GFR calc non Af Amer: >90 mL/min (ref >90)
Glucose, Bld: 96 mg/dL (ref 70–99)

## 2013-07-07 LAB — PROTIME-INR: INR: 0.97 (ref 0.00–1.49)

## 2013-07-07 MED ORDER — HEPARIN SOD (PORK) LOCK FLUSH 100 UNIT/ML IV SOLN
INTRAVENOUS | Status: AC | PRN
  Administered 2013-07-07 (×2): 500 [IU] via INTRAVENOUS

## 2013-07-07 MED ORDER — MIDAZOLAM HCL 2 MG/2ML IJ SOLN
INTRAMUSCULAR | Status: AC | PRN
  Administered 2013-07-07: 1 mg via INTRAVENOUS

## 2013-07-07 MED ORDER — FENTANYL CITRATE 0.05 MG/ML IJ SOLN
INTRAMUSCULAR | Status: AC
  Filled 2013-07-07: qty 2

## 2013-07-07 MED ORDER — SODIUM CHLORIDE 0.9 % IV SOLN: INTRAVENOUS | Status: DC

## 2013-07-07 MED ORDER — IOHEXOL 300 MG/ML  SOLN
150.0000 mL | Freq: Once | INTRAMUSCULAR | Status: AC | PRN
  Administered 2013-07-07: 100 mL via INTRAVENOUS

## 2013-07-07 MED ORDER — MIDAZOLAM HCL 2 MG/2ML IJ SOLN
INTRAMUSCULAR | Status: AC
  Filled 2013-07-07: qty 2

## 2013-07-07 MED ORDER — FENTANYL CITRATE 0.05 MG/ML IJ SOLN
INTRAMUSCULAR | Status: AC | PRN
  Administered 2013-07-07: 25 ug via INTRAVENOUS

## 2013-07-07 MED ORDER — SODIUM CHLORIDE 0.9 % IV SOLN
Freq: Once | INTRAVENOUS | Status: AC
  Administered 2013-07-07: 07:00:00 via INTRAVENOUS

## 2013-07-07 NOTE — Procedures (Signed)
S/P 4 vessel cerebral arteriogram  RT CFA approach. Findings . 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 2. No associated aneurysms seen.

## 2013-07-07 NOTE — ED Notes (Addendum)
requested

## 2013-07-07 NOTE — H&P (Signed)
HPI: Maria Friedman is an 62 y.o. female who has PMHx of c/o of right eye pain, right sided neck pain, right eye ptosis and headaches. 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 The patient was seen in follow-up consult on 01/12/13 and discussed images and conservative management and follow-up was decided at that time. The patient was encouraged to stop smoking, she states today she is still smoking 1/2 ppd and has not really tried to stop. She denies any difficulties with last angiogram procedure. She denies any worsening symptoms today. She denies headaches, right eye ptosis worsening, or neck pain. She does admit to one episode of right hand tingling 5 days ago and thinks she was positioned wrong on her arm, no recurrence since. She was treated for a left ear infection in august and states that has cleared up well. She denies any fever or chills. She denies any chest pain or shortness of breath. She denies any bleeding, blood in her stool or urine.    Past Medical History:  Past Medical History  Diagnosis Date  . Seasonal allergies   . Depression   . Anxiety   . ZOXWRUEA(540.9)     Past Surgical History:  Past Surgical History  Procedure Laterality Date  . Foot fracture surgery  1999    2 screws in foot , R  . Vaginal delivery      x3   . Tooth extraction      total extractions, set up for dentures   . Radiology with anesthesia N/A 01/08/2013    Procedure: RADIOLOGY WITH ANESTHESIA: CEREBRAL ANGIOGRAM;  Surgeon: Oneal Grout, MD;  Location: [facility] OR;  Service: Radiology;  Laterality: N/A;    Family History:  Family History  Problem Relation Age of Onset  . COPD Mother   . Diabetes Other   . Thyroid disease Other     Social History:  reports that she has been smoking Cigarettes.  She has been smoking about 0.50 packs per day. She does not have any  smokeless tobacco history on file. She reports that she drinks alcohol. She reports that she uses illicit drugs (Marijuana) about twice per week.  Allergies: No Known Allergies    Medication List    ASK your doctor about these medications       diclofenac 75 MG EC tablet  Commonly known as:  VOLTAREN  Take 75 mg by mouth 2 (two) times daily.     doxycycline 100 MG capsule  Commonly known as:  VIBRAMYCIN  Take 100 mg by mouth 2 (two) times daily.     fluticasone 50 MCG/ACT nasal spray  Commonly known as:  FLONASE  Place 2 sprays into the nose daily as needed for rhinitis.     gabapentin 300 MG capsule  Commonly known as:  NEURONTIN  Take 300 mg by mouth 3 (three) times daily.     HYDROcodone-acetaminophen 5-325 MG per tablet  Commonly known as:  NORCO/VICODIN  Take 1 tablet by mouth daily as needed for pain.     mirtazapine 15 MG tablet  Commonly known as:  REMERON  Take 15 mg by mouth at bedtime.       Please HPI for pertinent positives, otherwise complete 10 system ROS negative.  Physical Exam: BP 131/82  Pulse 60  Temp(Src) 97.9 F (36.6 C) (Oral)  Resp 18  Ht 5\' 5"  (1.651 m)  Wt 106 lb (48.081  kg)  BMI 17.64 kg/m2  SpO2 99% Body mass index is 17.64 kg/(m^2).  General Appearance:  Alert, cooperative, no distress  Head:  Normocephalic, without obvious abnormality, atraumatic  Neck: Supple, symmetrical, trachea midline  Lungs:   Clear to auscultation bilaterally, no w/r/r, respirations unlabored without use of accessory muscles.  Chest Wall:  No tenderness or deformity  Heart:  Regular rate and rhythm, S1, S2 normal, no murmur, rub or gallop.  Abdomen:   Soft, non-tender, non distended, (+) BS  Extremities: Extremities normal, atraumatic, no cyanosis or edema  Pulses: 1+ and symmetric  Neurologic: Normal affect, no gross deficits.   Results for orders placed during the hospital encounter of 07/07/13 (from the past 48 hour(s))  APTT     Status: None    Collection Time    07/07/13  6:59 AM      Result Value Range   aPTT 30  24 - 37 seconds  CBC WITH DIFFERENTIAL     Status: None   Collection Time    07/07/13  6:59 AM      Result Value Range   WBC 6.4  4.0 - 10.5 K/uL   RBC 4.20  3.87 - 5.11 MIL/uL   Hemoglobin 12.3  12.0 - 15.0 g/dL   HCT 16.1  09.6 - 04.5 %   MCV 88.6  78.0 - 100.0 fL   MCH 29.3  26.0 - 34.0 pg   MCHC 33.1  30.0 - 36.0 g/dL   RDW 40.9  81.1 - 91.4 %   Platelets 284  150 - 400 K/uL   Neutrophils Relative % 63  43 - 77 %   Neutro Abs 4.0  1.7 - 7.7 K/uL   Lymphocytes Relative 27  12 - 46 %   Lymphs Abs 1.7  0.7 - 4.0 K/uL   Monocytes Relative 7  3 - 12 %   Monocytes Absolute 0.4  0.1 - 1.0 K/uL   Eosinophils Relative 3  0 - 5 %   Eosinophils Absolute 0.2  0.0 - 0.7 K/uL   Basophils Relative 1  0 - 1 %   Basophils Absolute 0.0  0.0 - 0.1 K/uL  PROTIME-INR     Status: None   Collection Time    07/07/13  6:59 AM      Result Value Range   Prothrombin Time 12.7  11.6 - 15.2 seconds   INR 0.97  0.00 - 1.49   No results found.  Assessment/Plan History of headaches, right eye ptosis and right sided neck pain, today asymptomatic. 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 Patient is scheduled today for follow-up angiogram. Ongoing tobacco abuse 1/2 ppd. Labs reviewed, patient has been NPO Risks and Benefits discussed with the patient. All of the patient's questions were answered, patient is agreeable to proceed. Consent signed and in chart.   [name] D PA-C 07/07/2013, 7:58 AM

## 2013-07-17 ENCOUNTER — Encounter: Payer: Self-pay | Admitting: Gastroenterology

## 2013-09-30 ENCOUNTER — Other Ambulatory Visit: Payer: Medicaid Other | Admitting: Gastroenterology

## 2013-10-01 ENCOUNTER — Encounter: Payer: Self-pay | Admitting: Internal Medicine

## 2014-07-17 ENCOUNTER — Emergency Department (HOSPITAL_COMMUNITY)
Admission: EM | Admit: 2014-07-17 | Discharge: 2014-07-17 | Disposition: A | Discharge status: Home / Self Care | Payer: Medicaid Other | Attending: Emergency Medicine | Admitting: Emergency Medicine

## 2014-07-17 ENCOUNTER — Emergency Department (HOSPITAL_COMMUNITY): Payer: Medicaid Other

## 2014-07-17 ENCOUNTER — Encounter (HOSPITAL_COMMUNITY): Payer: Self-pay | Admitting: Emergency Medicine

## 2014-07-17 DIAGNOSIS — Z791 Long term (current) use of non-steroidal anti-inflammatories (NSAID): Secondary | ICD-10-CM | POA: Diagnosis not present

## 2014-07-17 DIAGNOSIS — Z79899 Other long term (current) drug therapy: Secondary | ICD-10-CM | POA: Diagnosis not present

## 2014-07-17 DIAGNOSIS — F419 Anxiety disorder, unspecified: Secondary | ICD-10-CM | POA: Diagnosis not present

## 2014-07-17 DIAGNOSIS — Z792 Long term (current) use of antibiotics: Secondary | ICD-10-CM | POA: Diagnosis not present

## 2014-07-17 DIAGNOSIS — R0789 Other chest pain: Secondary | ICD-10-CM | POA: Insufficient documentation

## 2014-07-17 DIAGNOSIS — F32A Depression, unspecified: Secondary | ICD-10-CM

## 2014-07-17 DIAGNOSIS — Z720 Tobacco use: Secondary | ICD-10-CM | POA: Insufficient documentation

## 2014-07-17 DIAGNOSIS — R079 Chest pain, unspecified: Secondary | ICD-10-CM

## 2014-07-17 DIAGNOSIS — F329 Major depressive disorder, single episode, unspecified: Secondary | ICD-10-CM | POA: Diagnosis not present

## 2014-07-17 DIAGNOSIS — Z7951 Long term (current) use of inhaled steroids: Secondary | ICD-10-CM | POA: Insufficient documentation

## 2014-07-17 LAB — COMPREHENSIVE METABOLIC PANEL WITH GFR
ALT: 47 U/L — ABNORMAL HIGH (ref 0–35)
AST: 60 U/L — ABNORMAL HIGH (ref 0–37)
Albumin: 4.7 g/dL (ref 3.5–5.2)
Alkaline Phosphatase: 118 U/L — ABNORMAL HIGH (ref 39–117)
Anion gap: 18 — ABNORMAL HIGH (ref 5–15)
BUN: 5 mg/dL — ABNORMAL LOW (ref 6–23)
CO2: 29 meq/L (ref 19–32)
Calcium: 10.5 mg/dL (ref 8.4–10.5)
Chloride: 99 meq/L (ref 96–112)
Creatinine, Ser: 0.61 mg/dL (ref 0.50–1.10)
GFR calc Af Amer: >90 mL/min
GFR calc non Af Amer: >90 mL/min
Glucose, Bld: 105 mg/dL — ABNORMAL HIGH (ref 70–99)
Potassium: 3.2 meq/L — ABNORMAL LOW (ref 3.7–5.3)
Sodium: 146 meq/L (ref 137–147)
Total Bilirubin: 0.4 mg/dL (ref 0.3–1.2)
Total Protein: 8.6 g/dL — ABNORMAL HIGH (ref 6.0–8.3)

## 2014-07-17 LAB — I-STAT TROPONIN, ED
TROPONIN I, POC: 0.01 ng/mL (ref 0.00–0.08)
TROPONIN I, POC: 0.01 ng/mL (ref 0.00–0.08)

## 2014-07-17 LAB — CBC
HCT: 40.2 % (ref 36.0–46.0)
Hemoglobin: 14 g/dL (ref 12.0–15.0)
MCH: 29.5 pg (ref 26.0–34.0)
MCHC: 34.8 g/dL (ref 30.0–36.0)
MCV: 84.8 fL (ref 78.0–100.0)
Platelets: 280 10*3/uL (ref 150–400)
RBC: 4.74 MIL/uL (ref 3.87–5.11)
RDW: 13.9 % (ref 11.5–15.5)
WBC: 7.1 10*3/uL (ref 4.0–10.5)

## 2014-07-17 LAB — ETHANOL: Alcohol, Ethyl (B): 268 mg/dL — ABNORMAL HIGH (ref 0–11)

## 2014-07-17 MED ORDER — LORAZEPAM 2 MG/ML IJ SOLN
1.0000 mg | Freq: Once | INTRAMUSCULAR | Status: AC
  Administered 2014-07-17: 1 mg via INTRAVENOUS
  Filled 2014-07-17: qty 1

## 2014-07-17 MED ORDER — ASPIRIN 81 MG PO CHEW
324.0000 mg | CHEWABLE_TABLET | Freq: Once | ORAL | Status: AC
  Administered 2014-07-17: 324 mg via ORAL
  Filled 2014-07-17: qty 4

## 2014-07-17 MED ORDER — MORPHINE SULFATE 4 MG/ML IJ SOLN
4.0000 mg | Freq: Once | INTRAMUSCULAR | Status: AC
Start: 2014-07-17 — End: 2014-07-17
  Administered 2014-07-17: 4 mg via INTRAVENOUS
  Filled 2014-07-17: qty 1

## 2014-07-17 MED ORDER — POTASSIUM CHLORIDE CRYS ER 20 MEQ PO TBCR
40.0000 meq | EXTENDED_RELEASE_TABLET | Freq: Once | ORAL | Status: AC
  Administered 2014-07-17: 40 meq via ORAL
  Filled 2014-07-17: qty 2

## 2014-07-17 NOTE — Discharge Instructions (Signed)
Chest Pain (Nonspecific) It is often hard to give a diagnosis for the cause of chest pain. There is always a chance that your pain could be related to something serious, such as a heart attack or a blood clot in the lungs. You need to follow up with your doctor. HOME CARE  If antibiotic medicine was given, take it as directed by your doctor. Finish the medicine even if you start to feel better.  For the next few days, avoid activities that bring on chest pain. Continue physical activities as told by your doctor.  Do not use any tobacco products. This includes cigarettes, chewing tobacco, and e-cigarettes.  Avoid drinking alcohol.  Only take medicine as told by your doctor.  Follow your doctor's suggestions for more testing if your chest pain does not go away.  Keep all doctor visits you made. GET HELP IF:  Your chest pain does not go away, even after treatment.  You have a rash with blisters on your chest.  You have a fever. GET HELP RIGHT AWAY IF:   You have more pain or pain that spreads to your arm, neck, jaw, back, or belly (abdomen).  You have shortness of breath.  You cough more than usual or cough up blood.  You have very bad back or belly pain.  You feel sick to your stomach (nauseous) or throw up (vomit).  You have very bad weakness.  You pass out (faint).  You have chills. This is an emergency. Do not wait to see if the problems will go away. Call your local emergency services (911 in [place] Do not drive yourself to the hospital. MAKE SURE YOU:   Understand these instructions.  Will watch your condition.  Will get help right away if you are not doing well or get worse. Document Released: 02/20/2008 Document Revised: 09/08/2013 Document Reviewed: 02/20/2008 [facility] Patient Information 2015 [place], [place]. This information is not intended to replace advice given to you by your health care provider. Make sure you discuss any questions you have with your  health care provider. Depression Depression refers to feeling sad, low, down in the dumps, blue, gloomy, or empty. In general, there are two kinds of depression: 1. Normal sadness or normal grief. This kind of depression is one that we all feel from time to time after upsetting life experiences, such as the loss of a job or the ending of a relationship. This kind of depression is considered normal, is short lived, and resolves within a few days to 2 weeks. Depression experienced after the loss of a loved one (bereavement) often lasts longer than 2 weeks but normally gets better with time. 2. Clinical depression. This kind of depression lasts longer than normal sadness or normal grief or interferes with your ability to function at home, at work, and in school. It also interferes with your personal relationships. It affects almost every aspect of your life. Clinical depression is an illness. Symptoms of depression can also be caused by conditions other than those mentioned above, such as:  Physical illness. Some physical illnesses, including underactive thyroid gland (hypothyroidism), severe anemia, specific types of cancer, diabetes, uncontrolled seizures, heart and lung problems, strokes, and chronic pain are commonly associated with symptoms of depression.  Side effects of some prescription medicine. In some people, certain types of medicine can cause symptoms of depression.  Substance abuse. Abuse of alcohol and illicit drugs can cause symptoms of depression. SYMPTOMS Symptoms of normal sadness and normal grief include the following:  Feeling sad or crying for short periods of time.  Not caring about anything (apathy).  Difficulty sleeping or sleeping too much.  No longer able to enjoy the things you used to enjoy.  Desire to be by oneself all the time (social isolation).  Lack of energy or motivation.  Difficulty concentrating or remembering.  Change in appetite or  weight.  Restlessness or agitation. Symptoms of clinical depression include the same symptoms of normal sadness or normal grief and also the following symptoms:  Feeling sad or crying all the time.  Feelings of guilt or worthlessness.  Feelings of hopelessness or helplessness.  Thoughts of suicide or the desire to harm yourself (suicidal ideation).  Loss of touch with reality (psychotic symptoms). Seeing or hearing things that are not real (hallucinations) or having false beliefs about your life or the people around you (delusions and paranoia). DIAGNOSIS  The diagnosis of clinical depression is usually based on how bad the symptoms are and how long they have lasted. Your health care provider will also ask you questions about your medical history and substance use to find out if physical illness, use of prescription medicine, or substance abuse is causing your depression. Your health care provider may also order blood tests. TREATMENT  Often, normal sadness and normal grief do not require treatment. However, sometimes antidepressant medicine is given for bereavement to ease the depressive symptoms until they resolve. The treatment for clinical depression depends on how bad the symptoms are but often includes antidepressant medicine, counseling with a mental health professional, or both. Your health care provider will help to determine what treatment is best for you. Depression caused by physical illness usually goes away with appropriate medical treatment of the illness. If prescription medicine is causing depression, talk with your health care provider about stopping the medicine, decreasing the dose, or changing to another medicine. Depression caused by the abuse of alcohol or illicit drugs goes away when you stop using these substances. Some adults need professional help in order to stop drinking or using drugs. SEEK IMMEDIATE MEDICAL CARE IF:  You have thoughts about hurting yourself or  others.  You lose touch with reality (have psychotic symptoms).  You are taking medicine for depression and have a serious side effect. FOR MORE INFORMATION  National Alliance on Mental Illness: www.nami.[employer] of Mental Health: https://carter.com/ Document Released: 08/31/2000 Document Revised: 01/18/2014 Document Reviewed: 12/03/2011 [facility] Patient Information 2015 [place], [place]. This information is not intended to replace advice given to you by your health care provider. Make sure you discuss any questions you have with your health care provider.   Emergency Department Resource Guide 1) Find a Doctor and Pay Out of Pocket Although you won't have to find out who is covered by your insurance plan, it is a good idea to ask around and get recommendations. You will then need to call the office and see if the doctor you have chosen will accept you as a new patient and what types of options they offer for patients who are self-pay. Some doctors offer discounts or will set up payment plans for their patients who do not have insurance, but you will need to ask so you aren't surprised when you get to your appointment.  2) Contact Your Local Health Department Not all health departments have doctors that can see patients for sick visits, but many do, so it is worth a call to see if yours does. If you don't know where your local health  department is, you can check in your phone book. The CDC also has a tool to help you locate your state's health department, and many state websites also have listings of all of their local health departments.  3) Find a [facility] If your illness is not likely to be very severe or complicated, you may want to try a walk in clinic. These are popping up all over the country in pharmacies, drugstores, and shopping centers. They're usually staffed by nurse practitioners or physician assistants that have been trained to treat common illnesses and  complaints. They're usually fairly quick and inexpensive. However, if you have serious medical issues or chronic medical problems, these are probably not your best option.  No Primary Care Doctor: - Call Health Connect at  [phone] - they can help you locate a primary care doctor that  accepts your insurance, provides certain services, etc. - Physician Referral Service- [phone]  Chronic Pain Problems: Organization         Address  Phone   Notes  [facility]  [phone] Patients need to be referred by their primary care doctor.   Medication Assistance: Organization         Address  Phone   Notes  [facility] Medication [facility] [address] [place], [place] [address] [phone] --Must be a resident of [facility] -- Must have NO insurance coverage whatsoever (no Medicaid/ Medicare, etc.) -- The pt. MUST have a primary care doctor that directs their care regularly and follows them in the community   Med[facility]ist  [phone]   [employer]  [phone]    Agencies that provide inexpensive medical care: Organization         Address  Phone   Notes  [place]  [phone]   [facility]    [phone]   [facility] [place], [place] [address] [phone]   [place] [address] [place] [phone]   [facility]anned Parenthood    [phone]   [facility]    [phone]   [place] [address], [place] Phone:  [phone], Fax:  [phone] Hours of Operation:  9 am - 6 pm, M-F.  Also accepts Medicaid/Medicare and self-pay.  [facility] for [place], [address], [place] Phone: [phone], Fax: [phone]. Hours of Operation:  8:30 am - 5:30 pm, M-F.  Also accepts Medicaid and self-pay.  [facility] [place] [address], [place] Phone: [phone]   [place], [place], [place] [phone], Ext. [phone] Mondays & Thursdays: 7-9 AM.  First 15 patients are seen on a first come, first serve basis.    [place] Providers:  Organization         Address  Phone   Notes  [facility] [address], [place] [phone] Also accepts self-pay patients.  [place], [place]  [phone]   [place], [address], [place] [phone]   [facility] [address] [place] [phone]   Lucianne Lei [address], [place]   [phone] Only accepts [place] patients after they have  their name applied to their card.   Self-Pay (no insurance) in [facility]:  Organization         Address  Phone   Notes  Sickle Cell Patients, [facility] [place] [phone]   [facility] [place] [phone]   [facility] [place]  [place], [place], [place] [phone]   [facility]/Dr. Osei-Bonsu  [address], [place] or [address], [place] [phone] Phone number for both [place] and [place] locations is the same.  [facility]gent [facility] and [facility] [address] [place] [phone]   [facility] [address] [place] or [address] [phone]   [facility] [address] [place] [phone], phone; [phone], fax Sees patients 1st and 3rd Saturday of every month.  Must not qualify for public or private insurance (i.e. Medicaid, Medicare, [place] Health Choice, Veterans' Benefits)  Household income should be no more than 200% of the poverty level  The clinic cannot treat you if you are pregnant or think you are pregnant  Sexually transmitted diseases are not treated at the clinic.    Dental Care: Organization         Address  Phone  Notes  [facility] Department of [facility] [place] [phone] Accepts children up to age 10 who are enrolled in [place] or [place]; pregnant women with a Medicaid card; and children who have applied for Medicaid or [place] Health Choice, but were declined, whose parents can pay a reduced fee at time of service.  [facility] Department of [facility]  [address], [place] [phone] Accepts children up to age 57 who are enrolled in [place] or [place]; pregnant women with a Medicaid card; and children who have applied for Medicaid or  Health Choice, but were declined, whose parents can pay a reduced fee at time of service.  [place] Adult Dental Access PROGRAM  [place] [phone] Patients are seen by appointment only. Walk-ins are not accepted. [place] will see patients 6 years of age and older. Monday - Tuesday (8am-5pm) Most Wednesdays (8:30-5pm) $30 per visit, cash only  [facility] Adult Dental Access PROGRAM  [address], [facility] [phone] Patients are seen by appointment only. Walk-ins are not accepted. Between will see patients 31 years of age and older. One Wednesday Evening (Monthly: Volunteer Based).  $30 per visit, cash only  [place]  [phone] for adults; Children under age 25, call Graduate Pediatric Dentistry at [phone]. Children aged 26-14, please call [phone] to request a pediatric application.  Dental services are provided in all areas of dental care including fillings, crowns and bridges, complete and partial dentures, implants, gum treatment, root canals, and extractions. Preventive care is  also provided. Treatment is provided to both adults and children. Patients are selected via a lottery and there is often a waiting list.   [facility] [address] [place]  [phone] [email or web address], [place], [place] [phone], Ext. [phone] Second and Fourth Thursday of each month, opens at 6:30 AM; Clinic ends at 9 AM.  Patients are seen on  a [employer] basis, and a limited number are seen during each clinic.   [facility]  [address] Hillard Danker [place], [place] [phone]   Eligibility Requirements You must have lived in [place], [place], or [place] counties for at least the last three months.   You cannot be eligible for state or federal sponsored [employer], including [employer], [place], or [employer].   You generally cannot be eligible for healthcare insurance through your employer.    How to apply: Eligibility screenings are held every Tuesday and Wednesday afternoon from 1:00 pm until 4:00 pm. You do not need an appointment for the interview!  [facility] [address], [place], [place]   [place]  [place] Department  [place]  [phone]    Behavioral Health Resources in the Community: Intensive Outpatient Programs Organization         Address  Phone  Notes  [place]. [address] [place], [place] [phone]   [facility] Outpatient [address], [place], [place]   ADS: [facility] Drug [facility] [address], [place], [place]   [place] [address],  [place], [place] or [phone]    Psychological Services Organization         Address  Phone  Notes  [facility]  [place]  [phone]    [place] [phone] [address] [place] or [phone]

## 2014-07-17 NOTE — ED Provider Notes (Signed)
One week, left, nonradiating CP; depressed (cause for presentation)  No SOB, cough, fever. Drinking daily (268 today)  No HI/SI, passive.  Plan: delta trop (due 3:15) - home if negative. Reassess for SI. Resources.  Re-evaluation:  Delta trop negative. She continues to endorse there are no suicidal thoughts or intent to harm herself. Per plan by Hyman Bible and Dr. Reather Converse, patient can be discharged home with resources for grief counselling and PCP follow up.  Dewaine Oats, PA-C 07/17/14 1652

## 2014-07-17 NOTE — ED Notes (Addendum)
Patient has been drinking more lately since death of relatives.  Possibly drank more than half of a 1/2 gallon of liquor today and last night.  Last time she drank was right before she came here.

## 2014-07-17 NOTE — ED Notes (Signed)
The pt reports since 2 of her family members passed away this summer shes been depressed. She states "my heart hurts since then." she says shes not been taking her daily medications. She denies thoughts of harming herself or others. She reports daily drug and alcohol use

## 2014-07-17 NOTE — ED Provider Notes (Signed)
CSN: [id number]     Arrival date & time 07/17/14  1247 History   First MD Initiated Contact with Patient 07/17/14 1321     Chief Complaint  Patient presents with  . Depression  . Chest Pain     (Consider location/radiation/quality/duration/timing/severity/associated sxs/prior Treatment) HPI Comments: Patient presents today with a chief complaint of chest pain.  She reports that the pain is located left anterior chest and does not radiate.  She reports that the pain has been constant for the past week.  She is unable to characterize the pain further.  She denies any SOB, nausea, vomiting, diaphoresis, numbness, tingling, dizziness, lightheadedness, or syncope.  Denies LE edema.  She also reports that she has been increasingly depressed since her brother died two months ago.  She denies SI or HI.  Her sister reports that today the patient expressed thoughts of wanting to die.  Patient also reports that she has been drinking alcohol daily to help with the depression.  Last drink just prior to arrival.  She denies Cocaine use or any other recreational drugs.  No history of HTN, Hyperlipidemia, or DM.  She denies any prior cardiac history.  No history of DVT or PE.  No prolonged travel or surgeries in the past 4 weeks.  No use of estrogen containing medications.  Patient is a 63 y.o. female presenting with chest pain. The history is provided by the patient.  Chest Pain   Past Medical History  Diagnosis Date  . Seasonal allergies   . Depression   . Anxiety   . RKYHCWCB(762.8)    Past Surgical History  Procedure Laterality Date  . Foot fracture surgery  1999    2 screws in foot , R  . Vaginal delivery      x3   . Tooth extraction      total extractions, set up for dentures   . Radiology with anesthesia N/A 01/08/2013    Procedure: RADIOLOGY WITH ANESTHESIA: CEREBRAL ANGIOGRAM;  Surgeon: Rob Hickman, MD;  Location: [facility];  Service: Radiology;  Laterality: N/A;   Family History   Problem Relation Age of Onset  . COPD Mother   . Diabetes Other   . Thyroid disease Other    History  Substance Use Topics  . Smoking status: Current Every Day Smoker -- 0.50 packs/day    Types: Cigarettes  . Smokeless tobacco: Not on file  . Alcohol Use: 0.0 oz/week     Comment: 40 oz. per day- Beer only   OB History   Grav Para Term Preterm Abortions TAB SAB Ect Mult Living                 Review of Systems  Cardiovascular: Positive for chest pain.  All other systems reviewed and are negative.     Allergies  Review of patient's allergies indicates no known allergies.  Home Medications   Prior to Admission medications   Medication Sig Start Date End Date Taking? Authorizing Provider  diclofenac (VOLTAREN) 75 MG EC tablet Take 75 mg by mouth 2 (two) times daily.    Historical Provider, MD  doxycycline (VIBRAMYCIN) 100 MG capsule Take 100 mg by mouth 2 (two) times daily.    Historical Provider, MD  fluticasone (FLONASE) 50 MCG/ACT nasal spray Place 2 sprays into the nose daily as needed for rhinitis.    Historical Provider, MD  gabapentin (NEURONTIN) 300 MG capsule Take 300 mg by mouth 3 (three) times daily.    Historical Provider,  MD  HYDROcodone-acetaminophen (NORCO/VICODIN) 5-325 MG per tablet Take 1 tablet by mouth daily as needed for pain.    Historical Provider, MD  mirtazapine (REMERON) 15 MG tablet Take 15 mg by mouth at bedtime.    Historical Provider, MD   BP 141/75  Pulse 95  Temp(Src) 98.3 F (36.8 C) (Oral)  Resp 11  SpO2 100% Physical Exam  Nursing note and vitals reviewed. Constitutional: She appears well-developed and well-nourished.  HENT:  Head: Normocephalic and atraumatic.  Mouth/Throat: Oropharynx is clear and moist.  Neck: Normal range of motion. Neck supple.  Cardiovascular: Normal rate, regular rhythm, normal heart sounds and intact distal pulses.   Pulmonary/Chest: Effort normal and breath sounds normal. No respiratory distress. She has  no wheezes. She has no rales. She exhibits tenderness.  Abdominal: Soft. Bowel sounds are normal. She exhibits no distension. There is no tenderness. There is no rebound and no guarding.  Musculoskeletal: Normal range of motion.  No LE edema bilaterally  Neurological: She is alert.  Skin: Skin is warm and dry. She is not diaphoretic.  Psychiatric: Her mood appears anxious. Her speech is tangential. She is agitated. She exhibits a depressed mood.    ED Course  Procedures (including critical care time) Labs Review Labs Reviewed  COMPREHENSIVE METABOLIC PANEL - Abnormal; Notable for the following:    Potassium 3.2 (*)    Glucose, Bld 105 (*)    BUN 5 (*)    Total Protein 8.6 (*)    AST 60 (*)    ALT 47 (*)    Alkaline Phosphatase 118 (*)    Anion gap 18 (*)    All other components within normal limits  ETHANOL - Abnormal; Notable for the following:    Alcohol, Ethyl (B) 268 (*)    All other components within normal limits  CBC  URINE RAPID DRUG SCREEN (HOSP PERFORMED)  I-STAT TROPOININ, ED    Imaging Review Dg Chest 2 View  07/17/2014   CLINICAL DATA:  Depression with chest pain  EXAM: CHEST  2 VIEW  COMPARISON:  None.  FINDINGS: There are probable nipple shadows bilaterally. Elsewhere lungs are clear. Heart size and pulmonary vascularity are normal. No adenopathy. No bone lesions.  IMPRESSION: Probable nipple shadows bilaterally. Repeat study with nipple markers to confirm advised. No edema or consolidation.   Electronically Signed   By: Lowella Grip M.D.   On: 07/17/2014 14:15     EKG Interpretation   Date/Time:  Saturday July 17 2014 12:57:36 EDT Ventricular Rate:  92 PR Interval:  128 QRS Duration: 102 QT Interval:  388 QTC Calculation: 479 R Axis:   55 Text Interpretation:  Normal sinus rhythm Biatrial enlargement Incomplete  right bundle branch block Nonspecific ST and T wave abnormality Abnormal  ECG Confirmed by ZAVITZ  MD, JOSHUA ([id number]) on 07/17/2014  1:02:37 PM     3:00 PM Reassessed patient.  She reports that her chest pain has improved at this time.   MDM   Final diagnoses:  Chest pain   Patient presents today with depression and chest pain.  Chest pain is atypical.  Pain has been constant for the past week and does not radiate.  Chest wall tender to palpation.  No associated symptoms.  EKG unremarkable.  Initial troponin negative.  CXR negative.  Remainder of labs unremarkable aside from an elevated Alcohol.  Patient denies SI or HI.  Patient does not request inpatient alcohol detox.  Patient signed out to Charlann Lange, PA-C at  shift change.  Plan is for a 3 hour troponin and reassess patient once the alcohol level comes down and the patient is clinically sober.  Patient also discussed with Dr. Reather Converse who is in agreement with the plan.     Hyman Bible, PA-C 07/18/14 2219  Mariea Clonts, MD 07/23/14 [phone]

## 2014-07-17 NOTE — ED Notes (Signed)
Maria Friedman at bedside

## 2014-07-17 NOTE — ED Notes (Signed)
Patient transported to X-ray 

## 2015-02-02 ENCOUNTER — Other Ambulatory Visit: Payer: Self-pay

## 2015-02-02 DIAGNOSIS — Z1231 Encounter for screening mammogram for malignant neoplasm of breast: Secondary | ICD-10-CM

## 2015-02-11 ENCOUNTER — Ambulatory Visit: Payer: Medicaid Other

## 2015-03-24 ENCOUNTER — Ambulatory Visit: Payer: Medicaid Other

## 2015-03-30 ENCOUNTER — Ambulatory Visit: Payer: Medicaid Other

## 2015-05-17 ENCOUNTER — Emergency Department (HOSPITAL_COMMUNITY)
Admission: EM | Admit: 2015-05-17 | Discharge: 2015-05-18 | Disposition: A | Discharge status: Other Acute Inpatient Hospital | Payer: Medicaid Other | Attending: Emergency Medicine | Admitting: Emergency Medicine

## 2015-05-17 ENCOUNTER — Encounter (HOSPITAL_COMMUNITY): Payer: Self-pay | Admitting: Emergency Medicine

## 2015-05-17 DIAGNOSIS — Z720 Tobacco use: Secondary | ICD-10-CM | POA: Insufficient documentation

## 2015-05-17 DIAGNOSIS — F1020 Alcohol dependence, uncomplicated: Secondary | ICD-10-CM | POA: Diagnosis present

## 2015-05-17 DIAGNOSIS — Z791 Long term (current) use of non-steroidal anti-inflammatories (NSAID): Secondary | ICD-10-CM | POA: Insufficient documentation

## 2015-05-17 DIAGNOSIS — F1010 Alcohol abuse, uncomplicated: Secondary | ICD-10-CM | POA: Diagnosis not present

## 2015-05-17 DIAGNOSIS — F329 Major depressive disorder, single episode, unspecified: Secondary | ICD-10-CM | POA: Insufficient documentation

## 2015-05-17 DIAGNOSIS — Z7952 Long term (current) use of systemic steroids: Secondary | ICD-10-CM | POA: Insufficient documentation

## 2015-05-17 DIAGNOSIS — F1094 Alcohol use, unspecified with alcohol-induced mood disorder: Secondary | ICD-10-CM | POA: Diagnosis present

## 2015-05-17 DIAGNOSIS — R45851 Suicidal ideations: Secondary | ICD-10-CM | POA: Diagnosis present

## 2015-05-17 DIAGNOSIS — F39 Unspecified mood [affective] disorder: Secondary | ICD-10-CM | POA: Diagnosis not present

## 2015-05-17 DIAGNOSIS — Z79899 Other long term (current) drug therapy: Secondary | ICD-10-CM | POA: Insufficient documentation

## 2015-05-17 LAB — COMPREHENSIVE METABOLIC PANEL
ALBUMIN: 4.4 g/dL (ref 3.5–5.0)
ALK PHOS: 88 U/L (ref 38–126)
ALT: 31 U/L (ref 14–54)
ANION GAP: 9 (ref 5–15)
AST: 47 U/L — AB (ref 15–41)
BUN: 13 mg/dL (ref 6–20)
CALCIUM: 9.3 mg/dL (ref 8.9–10.3)
CO2: 29 mmol/L (ref 22–32)
Chloride: 105 mmol/L (ref 101–111)
Creatinine, Ser: 0.8 mg/dL (ref 0.44–1.00)
GFR calc Af Amer: >60 mL/min (ref >60)
GFR calc non Af Amer: >60 mL/min (ref >60)
GLUCOSE: 108 mg/dL — AB (ref 65–99)
Potassium: 3.6 mmol/L (ref 3.5–5.1)
SODIUM: 143 mmol/L (ref 135–145)
Total Bilirubin: 0.2 mg/dL — ABNORMAL LOW (ref 0.3–1.2)
Total Protein: 8.2 g/dL — ABNORMAL HIGH (ref 6.5–8.1)

## 2015-05-17 LAB — RAPID URINE DRUG SCREEN, HOSP PERFORMED
Amphetamines: NOT DETECTED
BARBITURATES: NOT DETECTED
BENZODIAZEPINES: NOT DETECTED
COCAINE: NOT DETECTED
Opiates: NOT DETECTED
TETRAHYDROCANNABINOL: NOT DETECTED

## 2015-05-17 LAB — SALICYLATE LEVEL: Salicylate Lvl: <4 mg/dL (ref 2.8–30.0)

## 2015-05-17 LAB — CBC
HEMATOCRIT: 37.6 % (ref 36.0–46.0)
HEMOGLOBIN: 12.4 g/dL (ref 12.0–15.0)
MCH: 30.1 pg (ref 26.0–34.0)
MCHC: 33 g/dL (ref 30.0–36.0)
MCV: 91.3 fL (ref 78.0–100.0)
Platelets: 306 10*3/uL (ref 150–400)
RBC: 4.12 MIL/uL (ref 3.87–5.11)
RDW: 15.1 % (ref 11.5–15.5)
WBC: 8.8 10*3/uL (ref 4.0–10.5)

## 2015-05-17 LAB — ETHANOL: Alcohol, Ethyl (B): 298 mg/dL — ABNORMAL HIGH (ref <5)

## 2015-05-17 LAB — ACETAMINOPHEN LEVEL

## 2015-05-17 NOTE — ED Notes (Signed)
Pt. Wants no contact with daughter named [name].  Her daughter called before pt. Arrived telling the whole story and left her number.  The daughter named Francia Greaves does have a good relationship with pt.

## 2015-05-17 NOTE — ED Provider Notes (Signed)
CSN: [id number]     Arrival date & time 05/17/15  1636 History   First MD Initiated Contact with Patient 05/17/15 1712     Chief Complaint  Patient presents with  . Suicidal    to ED with [employer], IVC'd      The history is provided by the patient. No language interpreter was used.   Maria Friedman presents for involuntary commitment. Patient states that she is a regular alcohol drinker and that she just wanted to drink next to her brother's grave and go home and lay down. She states that this is usual behavior for her. She denies any SI or HI. The patient's daughter filled out IVC paperwork today stating that the patient is a danger to herself due to heavy alcohol use that she is talking to her brother dead brother and walking in the street yelling and threatening people. The patient states the IVC papers were taken out over financial issues.  Past Medical History  Diagnosis Date  . Seasonal allergies   . Depression   . Anxiety   . EEFEOFHQ(197.5)    Past Surgical History  Procedure Laterality Date  . Foot fracture surgery  1999    2 screws in foot , R  . Vaginal delivery      x3   . Tooth extraction      total extractions, set up for dentures   . Radiology with anesthesia N/A 01/08/2013    Procedure: RADIOLOGY WITH ANESTHESIA: CEREBRAL ANGIOGRAM;  Surgeon: Rob Hickman, MD;  Location: [facility];  Service: Radiology;  Laterality: N/A;   Family History  Problem Relation Age of Onset  . COPD Mother   . Diabetes Other   . Thyroid disease Other    Social History  Substance Use Topics  . Smoking status: Current Every Day Smoker -- 0.50 packs/day    Types: Cigarettes  . Smokeless tobacco: None  . Alcohol Use: 0.0 oz/week     Comment: 40 oz. per day- Beer only   OB History    No data available     Review of Systems  All other systems reviewed and are negative.     Allergies  Review of patient's allergies indicates no known allergies.  Home Medications   Prior to  Admission medications   Medication Sig Start Date End Date Taking? Authorizing Provider  diclofenac (VOLTAREN) 75 MG EC tablet Take 75 mg by mouth 2 (two) times daily.   Yes Historical Provider, MD  fluticasone (FLONASE) 50 MCG/ACT nasal spray Place 2 sprays into the nose daily as needed for rhinitis.   Yes Historical Provider, MD  gabapentin (NEURONTIN) 300 MG capsule Take 300 mg by mouth 3 (three) times daily.   Yes Historical Provider, MD  HYDROcodone-acetaminophen (NORCO/VICODIN) 5-325 MG per tablet Take 1 tablet by mouth daily as needed for moderate pain or severe pain.    Yes Historical Provider, MD  mirtazapine (REMERON) 15 MG tablet Take 15 mg by mouth at bedtime.   Yes Historical Provider, MD   BP 128/77 mmHg  Pulse 87  Temp(Src) 98.1 F (36.7 C) (Oral)  Resp 20  Ht 5\' 4"  (1.626 m)  Wt 97 lb (43.999 kg)  BMI 16.64 kg/m2  SpO2 96% Physical Exam  Constitutional: She is oriented to person, place, and time. She appears well-developed and well-nourished.  HENT:  Head: Normocephalic and atraumatic.  Cardiovascular: Normal rate and regular rhythm.   No murmur heard. Pulmonary/Chest: Effort normal and breath sounds normal. No respiratory  distress.  Abdominal: Soft. There is no tenderness. There is no rebound and no guarding.  Musculoskeletal: She exhibits no edema or tenderness.  Neurological: She is alert and oriented to person, place, and time.  Skin: Skin is warm and dry.  Psychiatric: She has a normal mood and affect. Her behavior is normal.  Nursing note and vitals reviewed.   ED Course  Procedures (including critical care time) Labs Review Labs Reviewed  COMPREHENSIVE METABOLIC PANEL - Abnormal; Notable for the following:    Glucose, Bld 108 (*)    Total Protein 8.2 (*)    AST 47 (*)    Total Bilirubin 0.2 (*)    All other components within normal limits  ETHANOL - Abnormal; Notable for the following:    Alcohol, Ethyl (B) 298 (*)    All other components within  normal limits  ACETAMINOPHEN LEVEL - Abnormal; Notable for the following:    Acetaminophen (Tylenol), Serum <10 (*)    All other components within normal limits  SALICYLATE LEVEL  CBC  URINE RAPID DRUG SCREEN, HOSP PERFORMED    Imaging Review No results found. I have personally reviewed and evaluated these images and lab results as part of my medical decision-making.   EKG Interpretation None      MDM   Final diagnoses:  Alcohol abuse  Mood disorder    Patient with history of alcohol abuse here for involuntary commitment, papers filled out by family. Patient is not suicidal emergency department but she is mildly intoxicated. She has been medically cleared for psychiatric evaluation.    Quintella Reichert, MD 05/18/15 [phone]

## 2015-05-17 NOTE — [facility] Counselor (Signed)
Pt's daughter, [name], can be reached at: [phone]. Number in chart is not up-to-date.  Pt's daughter who took out IVC paperwork [name]) can be reached at: [phone].

## 2015-05-17 NOTE — ED Notes (Signed)
Pt awake, alert, arrived via [employer] with IVC papers.  Per IVC papers, pt is a danger to herself, states she wants to die, drinking heavily, talking to dead brother, jumped out of a moving car, walking in the street yelling and threatening wants to kill someone or herself.  Mildly uncooperative per [employer].

## 2015-05-17 NOTE — ED Notes (Signed)
Pt denies SI/HI, AH/VH.  Pt reports feeling depressed "i have some good days and i have some bad days".  Pt reports both her brother and mother died within a month of each other.  Pt sts "I do drink a lot, some days more than others".  Pt denies wanting help for detox.  Pt denies need to be in ER.  Pt calm/coop at this time.  [employer] remains present.  Skin PWD.  MAEI, ambulatory with steady gait.  Speaking full sentences, rr even/un-lab.  NAD.

## 2015-05-17 NOTE — ED Notes (Signed)
Patient calm, cooperative. Does not want to be on the unit. Denies SI, HI, AVH. Reports that she drinks alcohol and enjoys alcohol. States her eldest daughter is the issue. Reports finances may be behind her daughter having her IVCd. States she has "good days and bad days" as she still misses her mother and a brother who died around 5 months ago. States she has never sought help for her grief. States she also does not speak with her sister. Denies any chronic conditions or complaints.  Encouragement offered. Given snack.  Q 15 safety checks in place.

## 2015-05-17 NOTE — [facility] Counselor (Signed)
Disposition: Per Serena Colonel, NP, psychiatric re-evaluation is recommended in the morning to uphold or rescind IVC.   TTS counselor informed pt of the disposition personally.  Counselor informed pt's RN, Jinny Blossom, as well.   Ramond Dial, [facility] Therapeutic Triage
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
## 2015-05-17 NOTE — ED Notes (Signed)
Patient's youngest daughter [name] called to inquire about picking her mother up. Informed her that plan of care would be determined 8/31.

## 2015-05-17 NOTE — BH Assessment (Signed)
[facility] Assessment Progress Note    Called to schedule tele assessment with pt and pt doesn't have personnel to sit with the pt with tele psych machine.  Per Debarah Crape, [facility], pt to be seen by TTS on next shift.  Shaune Pascal, MS, [facility] Therapeutic Triage Specialist [facility]

## 2015-05-17 NOTE — ED Notes (Signed)
Pt oriented to room and unit.  Pt denies SI, HI and AVH.  She agrees she has a drinking problem but is adamant that she is not suicidal.  She is irritable but redirectable.  15 minute checks in place.

## 2015-05-18 ENCOUNTER — Encounter (HOSPITAL_COMMUNITY): Payer: Self-pay | Admitting: General Practice

## 2015-05-18 ENCOUNTER — Inpatient Hospital Stay (HOSPITAL_COMMUNITY)
Admission: AD | Admit: 2015-05-18 | Discharge: 2015-05-21 | DRG: 897 | Disposition: A | Discharge status: Home / Self Care | Payer: Medicaid Other | Source: Intra-hospital | Attending: Psychiatry | Admitting: Psychiatry

## 2015-05-18 DIAGNOSIS — F1094 Alcohol use, unspecified with alcohol-induced mood disorder: Secondary | ICD-10-CM | POA: Diagnosis not present

## 2015-05-18 DIAGNOSIS — Z91410 Personal history of adult physical and sexual abuse: Secondary | ICD-10-CM | POA: Diagnosis not present

## 2015-05-18 DIAGNOSIS — F39 Unspecified mood [affective] disorder: Secondary | ICD-10-CM | POA: Diagnosis not present

## 2015-05-18 DIAGNOSIS — Z79899 Other long term (current) drug therapy: Secondary | ICD-10-CM

## 2015-05-18 DIAGNOSIS — A599 Trichomoniasis, unspecified: Secondary | ICD-10-CM | POA: Diagnosis present

## 2015-05-18 DIAGNOSIS — F1099 Alcohol use, unspecified with unspecified alcohol-induced disorder: Secondary | ICD-10-CM | POA: Diagnosis not present

## 2015-05-18 DIAGNOSIS — F1020 Alcohol dependence, uncomplicated: Secondary | ICD-10-CM | POA: Diagnosis present

## 2015-05-18 DIAGNOSIS — Y908 Blood alcohol level of 240 mg/100 ml or more: Secondary | ICD-10-CM | POA: Diagnosis present

## 2015-05-18 DIAGNOSIS — F17210 Nicotine dependence, cigarettes, uncomplicated: Secondary | ICD-10-CM | POA: Diagnosis present

## 2015-05-18 DIAGNOSIS — F1024 Alcohol dependence with alcohol-induced mood disorder: Principal | ICD-10-CM | POA: Diagnosis present

## 2015-05-18 MED ORDER — VITAMIN B-1 100 MG PO TABS
100.0000 mg | ORAL_TABLET | Freq: Every day | ORAL | Status: DC
  Filled 2015-05-18 (×5): qty 1

## 2015-05-18 MED ORDER — THIAMINE HCL 100 MG/ML IJ SOLN: 100.0000 mg | Freq: Every day | INTRAMUSCULAR | Status: DC

## 2015-05-18 MED ORDER — LORAZEPAM 1 MG PO TABS: 0.0000 mg | ORAL_TABLET | Freq: Four times a day (QID) | ORAL | Status: DC

## 2015-05-18 MED ORDER — TRAZODONE HCL 50 MG PO TABS: 50.0000 mg | ORAL_TABLET | Freq: Every evening | ORAL | Status: DC | PRN

## 2015-05-18 MED ORDER — LORAZEPAM 1 MG PO TABS: 1.0000 mg | ORAL_TABLET | Freq: Four times a day (QID) | ORAL | Status: DC | PRN

## 2015-05-18 MED ORDER — LORAZEPAM 1 MG PO TABS: 0.0000 mg | ORAL_TABLET | Freq: Two times a day (BID) | ORAL | Status: DC

## 2015-05-18 MED ORDER — LORAZEPAM 1 MG PO TABS: 1.0000 mg | ORAL_TABLET | Freq: Every day | ORAL | Status: DC

## 2015-05-18 MED ORDER — LOPERAMIDE HCL 2 MG PO CAPS
2.0000 mg | ORAL_CAPSULE | ORAL | Status: DC | PRN
Start: 2015-05-18 — End: 2015-05-21

## 2015-05-18 MED ORDER — ONDANSETRON 4 MG PO TBDP: 4.0000 mg | ORAL_TABLET | Freq: Four times a day (QID) | ORAL | Status: DC | PRN

## 2015-05-18 MED ORDER — VITAMIN B-1 100 MG PO TABS
100.0000 mg | ORAL_TABLET | Freq: Every day | ORAL | Status: DC
  Filled 2015-05-18: qty 1

## 2015-05-18 MED ORDER — VITAMIN B-1 100 MG PO TABS: 100.0000 mg | ORAL_TABLET | Freq: Every day | ORAL | Status: DC

## 2015-05-18 MED ORDER — MIRTAZAPINE 7.5 MG PO TABS
7.5000 mg | ORAL_TABLET | Freq: Every day | ORAL | Status: DC
  Administered 2015-05-18: 7.5 mg via ORAL
  Filled 2015-05-18 (×5): qty 1

## 2015-05-18 MED ORDER — LORAZEPAM 1 MG PO TABS
1.0000 mg | ORAL_TABLET | Freq: Four times a day (QID) | ORAL | Status: AC
  Filled 2015-05-18 (×2): qty 1

## 2015-05-18 MED ORDER — HYDROXYZINE HCL 25 MG PO TABS: 25.0000 mg | ORAL_TABLET | Freq: Four times a day (QID) | ORAL | Status: DC | PRN

## 2015-05-18 MED ORDER — MIRTAZAPINE 7.5 MG PO TABS
7.5000 mg | ORAL_TABLET | Freq: Every day | ORAL | Status: DC
  Filled 2015-05-18: qty 1

## 2015-05-18 MED ORDER — LORAZEPAM 2 MG/ML IJ SOLN: 0.0000 mg | Freq: Four times a day (QID) | INTRAMUSCULAR | Status: DC

## 2015-05-18 MED ORDER — LORAZEPAM 2 MG/ML IJ SOLN
0.0000 mg | Freq: Two times a day (BID) | INTRAMUSCULAR | Status: DC
Start: 2015-05-18 — End: 2015-05-18

## 2015-05-18 MED ORDER — ENSURE ENLIVE PO LIQD
237.0000 mL | Freq: Two times a day (BID) | ORAL | Status: DC
  Administered 2015-05-19 – 2015-05-21 (×4): 237 mL via ORAL

## 2015-05-18 MED ORDER — LORAZEPAM 1 MG PO TABS: 1.0000 mg | ORAL_TABLET | Freq: Two times a day (BID) | ORAL | Status: AC

## 2015-05-18 MED ORDER — LORAZEPAM 1 MG PO TABS: 1.0000 mg | ORAL_TABLET | Freq: Once | ORAL | Status: DC

## 2015-05-18 MED ORDER — THIAMINE HCL 100 MG/ML IJ SOLN: 100.0000 mg | Freq: Once | INTRAMUSCULAR | Status: DC

## 2015-05-18 MED ORDER — LORAZEPAM 2 MG/ML IJ SOLN: 0.0000 mg | Freq: Two times a day (BID) | INTRAMUSCULAR | Status: DC

## 2015-05-18 MED ORDER — ADULT MULTIVITAMIN W/MINERALS CH
1.0000 | ORAL_TABLET | Freq: Every day | ORAL | Status: DC
  Administered 2015-05-18 – 2015-05-21 (×3): 1 via ORAL
  Filled 2015-05-18 (×6): qty 1

## 2015-05-18 MED ORDER — LORAZEPAM 1 MG PO TABS
1.0000 mg | ORAL_TABLET | Freq: Three times a day (TID) | ORAL | Status: AC
  Filled 2015-05-18: qty 1

## 2015-05-18 NOTE — Progress Notes (Signed)
D: Patient alert and oriented x 4. Patient denies pain/SI/HI/AVH. Patient states, "My daughter just came up here from [place], and she don't like me drinking, and just had to say something to me knowing I was going to say something back. Now I am here."  Patient did admit to this writer she has a drinking problem but does not drink everyday only twice a week when her and friends play spades.  Patient refused ativan stating, "I don't want to take that medicine." This writer advised patient the importance of medication compliance. Patient verbalized understanding.   A: Staff to monitor Q 15 mins for safety. Encouragement and support offered. Scheduled medications administered per orders. R: Patient remains safe on the unit. Patient attended group tonight. Patient visible on hte unit and interacting with peers. Patient taking administered medications.

## 2015-05-18 NOTE — ED Notes (Signed)
Pt d/c with [facility] to [facility]. All items returned to [facility].

## 2015-05-18 NOTE — Progress Notes (Signed)
Patient attended the NA group meeting and was very attentive.

## 2015-05-18 NOTE — Progress Notes (Signed)
Patient ID: [name], female   DOB: [birth year], 64 y.o.   MRN: [id number]  Maria Friedman is a 64 year old female admitted to [facility] IVC after daughter took petition out because she reported patient has substance abuse issues. Patient reports that her daughter is trying to treat her like she is her mother. Patient reports a past history of anxiety, depression, and headaches. She reports "social drinking" with friends playing spades but denies any issues with substances. Per record review patient uses THC and crack cocaine however patient's UDS is negative. Patient minimizes alcohol use although her alcohol level was 298 on admission. She denies SI/HI and A/V hallucinations at this time. Patient reports she needs to get back to work and to clean out her mothers house. Patient does acknowledge that her mother has passed away and her brother shortly after however she reports she has support from a pastor. She was oriented to the unit and verbalized understanding of the admission process. Q15 minute safety checks initiated and are maintained.
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
## 2015-05-18 NOTE — Tx Team (Signed)
Initial Interdisciplinary Treatment Plan   PATIENT STRESSORS: Marital or family conflict   PATIENT STRENGTHS: Active sense of humor General fund of knowledge   PROBLEM LIST: Problem List/Patient Goals Date to be addressed Date deferred Reason deferred Estimated date of resolution  Substance Abuse 05/18/2015           Depression 05/18/2015           "Socialize and meet people" 05/18/2015           "Get back to the house" 05/18/2015                  DISCHARGE CRITERIA:  Improved stabilization in mood, thinking, and/or behavior Verbal commitment to aftercare and medication compliance Withdrawal symptoms are absent or subacute and managed without 24-hour nursing intervention  PRELIMINARY DISCHARGE PLAN: Outpatient therapy Return to previous living arrangement  PATIENT/FAMIILY INVOLVEMENT: This treatment plan has been presented to and reviewed with the patient, [name].  The patient and family have been given the opportunity to ask questions and make suggestions.  Lyle Leisner E 05/18/2015, 7:01 PM

## 2015-05-18 NOTE — Progress Notes (Signed)
Per psychiatrist and NP, patient recommended for inpatient treatment.   Maria Friedman, [place] Work  [employer] [phone]

## 2015-05-18 NOTE — BH Assessment (Signed)
[place] Assessment Progress Note  Per Corena Pilgrim, MD, this pt requires psychiatric hospitalization.  Pt is under IVC initiated by her daughter and upheld by Dr. Darleene Cleaver.  Letitia Libra, RN, [facility], has assigned pt to [facility] Rm 300-2.  Pt has signed Consent to Release Information and the signed form has been faxed to [facility] along with IVC paperwork.  Pt's nurse, Jan, has been notified.  Jalene Mullet, [place] Triage Specialist [phone]

## 2015-05-18 NOTE — ED Notes (Signed)
Pt is in her room watching TV this morning waiting on MD for evaluation. When asked about hallucinations, pt looked away verbally denied and said that her daughter made her come to the hospital. Pt denies si and hi.

## 2015-05-19 ENCOUNTER — Encounter (HOSPITAL_COMMUNITY): Payer: Self-pay | Admitting: Psychiatry

## 2015-05-19 DIAGNOSIS — F1099 Alcohol use, unspecified with unspecified alcohol-induced disorder: Secondary | ICD-10-CM
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
## 2015-05-19 NOTE — Progress Notes (Signed)
D: Pt has appropriate affect and pleasant mood.  She reports her day has been "pretty good" and that she is planning to discharge Saturday.  Pt reports her aftercare plans are "try to get into some AA classes, get my mother's house cleaned out, and I work every other week."  She reports her goal today was "knowing that I was getting out of here."  Pt denies SI/HI, denies hallucinations, denies pain.  Pt has been visible in milieu interacting with peers and staff appropriately.  Pt attended evening group.   A: Introduced self to pt.  Met with pt 1:1 and provided support and encouragement.  Actively listened to pt.  Medications offered per order.   R: Pt refused scheduled HS medication, stating "I sleep fine."  Pt verbally contracts for safety.  Will continue to monitor and assess.

## 2015-05-19 NOTE — Progress Notes (Signed)
[place] Group Notes:  (Nursing/MHT/Case Management/Adjunct)  Date:  05/19/2015  Time:  2045  Type of Therapy:  wrap up group  Participation Level:  Active  Participation Quality:  Appropriate, Attentive, Sharing and Supportive  Affect:  Appropriate  Cognitive:  Appropriate  Insight:  Improving  Engagement in Group:  Engaged  Modes of Intervention:  Clarification, Education and Support  Summary of Progress/Problems: Pt shared that she was going to be "cutting back drinking because you just can't stop drinking" and pt reported that she will be returning to her [place] meetings.   Jacques Navy 05/19/2015, 10:37 PM
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
## 2015-05-19 NOTE — [facility] Group Notes (Signed)
[place] LCSW Group Therapy  05/19/2015 12:49 PM  Type of Therapy:  Group Therapy  Participation Level:  Active  Participation Quality:  Attentive  Affect:  Appropriate  Cognitive:  Alert and Oriented  Insight:  Engaged  Engagement in Therapy:  Engaged  Modes of Intervention:  Confrontation, Discussion, Education, Exploration, Problem-solving, Rapport Building, Socialization and Support  Summary of Progress/Problems:  Finding Balance in Life. Today's group focused on defining balance in one's own words, identifying things that can knock one off balance, and exploring healthy ways to maintain balance in life. Group members were asked to provide an example of a time when they felt off balance, describe how they handled that situation,and process healthier ways to regain balance in the future. Group members were asked to share the most important tool for maintaining balance that they learned while at [facility] and how they plan to apply this method after discharge. Maria Friedman was attentive and engaged during today's processing group. She shared that for her, balance means "having freedom to come and go as I please and cutting down on alcoholism." Maria Friedman shared that her brother and mother died a few months apart last year and stated that she is still grieving from these losses. "Sometimes I drink to deal with it and I know I need to cut down." She shared that she is interested in going to [place] meetings when she leaves but down not plan to stop drinking completely. Pt offered Grief counseling information through Hospice.   Smart, Latayna Ritchie LCSWA 05/19/2015, 12:49 PM

## 2015-05-19 NOTE — [facility] Group Notes (Signed)
[place] Group Notes:  (Nursing/MHT/Case Management/Adjunct)  Date:  05/19/2015  0900 Type of Therapy:  Nurse Education  Participation Level:  Active  Participation Quality:  Appropriate, Attentive and Supportive  Affect:  Appropriate  Cognitive:  Alert and Appropriate  Insight:  Appropriate and Good  Engagement in Group:  Engaged and Supportive  Modes of Intervention:  Discussion, Education and Support  Summary of Progress/Problems:  Patient participated in group.  Reports goal is to work on going home.    Mart Piggs 05/19/2015, 10:34 AM

## 2015-05-19 NOTE — Plan of Care (Signed)
Problem: Alteration in mood & ability to function due to Goal: STG-Patient will attend groups Outcome: Progressing Pt attended evening group on 05/19/15.

## 2015-05-19 NOTE — Progress Notes (Signed)
D:  Patient is calm and cooperative.  Denies auditory and visual hallucination.  Patient refused Ativan and CIWA assessment.  Denies withdrawal symptoms.  Patient states, "I'm not trying to kill myself, nothing is wrong with me." Rates depression, hopelessness, and anxiety at 10 out of 10. A: Patient maintained on routine safety checks per protocol.  Support and encouragement offered as needed. R:  Attended all group therapy.  Socializing in the dayroom with peers.

## 2015-05-19 NOTE — BHH Suicide Risk Assessment (Signed)
[facility] Admission Suicide Risk Assessment   Nursing information obtained from:  Patient Demographic factors:  Living alone Current Mental Status:  NA Loss Factors:  Loss of significant relationship (mother and brother passed away 14 months ago approx.) Historical Factors:  Family history of mental illness or substance abuse Risk Reduction Factors:  Employed, Religious beliefs about death, Positive social support, Sense of responsibility to family Total Time spent with patient: 45 minutes Principal Problem: <principal problem not specified> Diagnosis:   Patient Active Problem List   Diagnosis Date Noted  . Alcohol use disorder, severe, dependence [F10.20] 05/18/2015  . 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 Severe alcohol use disorder [F10.99] 05/18/2015  . Mood disorder [F39]      Continued Clinical Symptoms:  Alcohol Use Disorder Identification Test Final Score (AUDIT): 10 The "Alcohol Use Disorders Identification Test", Guidelines for Use in Primary Care, Second Edition.  World Pharmacologist [facility]). Score between 0-7:  no or low risk or alcohol related problems. Score between 8-15:  moderate risk of alcohol related problems. Score between 16-19:  high risk of alcohol related problems. Score 20 or above:  warrants further diagnostic evaluation for alcohol dependence and treatment.   CLINICAL FACTORS:   Depression:   Comorbid alcohol abuse/dependence Alcohol/Substance Abuse/Dependencies    Psychiatric Specialty Exam: Physical Exam  ROS  Blood pressure 122/81, pulse 76, temperature 98.7 F (37.1 C), temperature source Oral, resp. rate 16, height 5\' 3"  (1.6 m), weight 42.185 kg (93 lb), SpO2 100 %.Body mass index is 16.48 kg/(m^2).     COGNITIVE FEATURES THAT CONTRIBUTE TO RISK:  Closed-mindedness, Polarized thinking and Thought constriction (tunnel vision)    SUICIDE RISK:   Moderate:  Frequent suicidal ideation with limited intensity, and duration, some  specificity in terms of plans, no associated intent, good self-control, limited dysphoria/symptomatology, some risk factors present, and identifiable protective factors, including available and accessible social support.  PLAN OF CARE: See Admission H and PE  Medical Decision Making:  Review of Psycho-Social Stressors (1), Review or order clinical lab tests (1), Review of Medication Regimen & Side Effects (2) and Review of New Medication or Change in Dosage (2)  I certify that inpatient services furnished can reasonably be expected to improve the patient's condition.   [place] A 05/19/2015, 8:34 PM

## 2015-05-19 NOTE — Progress Notes (Signed)
NUTRITION ASSESSMENT  Pt identified as at risk on the Malnutrition Screen Tool  INTERVENTION: 1. Educated patient on the importance of nutrition and encouraged intake of food and beverages. 2. Discussed weight goals. 3. Supplements: continue Ensure Enlive BID, each supplement provides 350 kcal and 20 grams of protein  NUTRITION DIAGNOSIS: Unintentional weight loss related to sub-optimal intake as evidenced by pt report.   Goal: Pt to meet >/= 90% of their estimated nutrition needs.  Monitor:  PO intake  Assessment:  Pt seen for MST. Pt IVC to [facility]. Prior to admission pt was drinking very heavily; partly related to the recent deaths of her mother and brother.   Pt unsure of any recent weight changes. Limited weight hx available.  Ensure Enlive has been ordered BID.  64 y.o. female  Height: Ht Readings from Last 1 Encounters:  05/18/15 5\' 3"  (1.6 m)    Weight: Wt Readings from Last 1 Encounters:  05/18/15 93 lb (42.185 kg)    Weight Hx: Wt Readings from Last 10 Encounters:  05/18/15 93 lb (42.185 kg)  05/17/15 97 lb (43.999 kg)  07/07/13 106 lb (48.081 kg)  01/06/13 113 lb 14.4 oz (51.665 kg)    BMI:  Body mass index is 16.48 kg/(m^2). Pt meets criteria for underweight based on current BMI.  Estimated Nutritional Needs: Kcal: 25-30 kcal/kg Protein: > 1 gram protein/kg Fluid: 1 ml/kcal  Diet Order: Diet regular Room service appropriate?: Yes; Fluid consistency:: Thin Pt is also offered choice of unit snacks mid-morning and mid-afternoon.  Pt is eating as desired.   Lab results and medications reviewed.      Jarome Matin, RD, LDN Inpatient Clinical Dietitian Pager # [phone] After hours/weekend pager # [phone]

## 2015-05-19 NOTE — Plan of Care (Signed)
Problem: Alteration in mood & ability to function due to Goal: LTG-Patient demonstrates decreased signs of withdrawal (Patient demonstrates decreased signs of withdrawal to the point the patient is safe to return home and continue treatment in an outpatient setting)  Outcome: Progressing Patient denies any symptoms of withdrawal and none observed.
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
## 2015-05-20 LAB — URINALYSIS, ROUTINE W REFLEX MICROSCOPIC
Bilirubin Urine: NEGATIVE
GLUCOSE, UA: NEGATIVE mg/dL
Hgb urine dipstick: NEGATIVE
Ketones, ur: NEGATIVE mg/dL
Nitrite: NEGATIVE
PH: 7.5 (ref 5.0–8.0)
PROTEIN: NEGATIVE mg/dL
Specific Gravity, Urine: 1.015 (ref 1.005–1.030)
Urobilinogen, UA: 0.2 mg/dL (ref 0.0–1.0)

## 2015-05-20 LAB — URINE MICROSCOPIC-ADD ON

## 2015-05-20 MED ORDER — METRONIDAZOLE 500 MG PO TABS
500.0000 mg | ORAL_TABLET | Freq: Two times a day (BID) | ORAL | Status: DC
  Administered 2015-05-20 – 2015-05-21 (×2): 500 mg via ORAL
  Filled 2015-05-20 (×6): qty 1

## 2015-05-20 MED ORDER — METRONIDAZOLE 500 MG PO TABS
500.0000 mg | ORAL_TABLET | Freq: Two times a day (BID) | ORAL | Status: DC
  Filled 2015-05-20 (×4): qty 1

## 2015-05-20 NOTE — Progress Notes (Signed)
Nutrition Education Note  Pt attended group focusing on general, healthful nutrition education.  RD emphasized the importance of eating regular meals and snacks throughout the day. Consuming sugar-free beverages and incorporating fruits and vegetables into diet when possible. Provided examples of healthy snacks. Patient encouraged to leave group with a goal to improve nutrition/healthy eating.   Diet Order: Diet regular Room service appropriate?: Yes; Fluid consistency:: Thin Pt is also offered choice of unit snacks mid-morning and mid-afternoon.  Pt is eating as desired.   If additional nutrition issues arise, please consult RD.     Twinkle Sockwell, RD, LDN Inpatient Clinical Dietitian Pager # [phone] After hours/weekend pager # [phone]     

## 2015-05-20 NOTE — Progress Notes (Signed)
D: Patient is alert, calm and cooperative.  Denies pain, auditory and visual hallucinations.  Refused Ativan protocol.  Patient complain of yellowish discharge but no burning or itching on urination.  Urinalysis ordered per MD. A:  Patient started on antibiotic therapy Flagyl 500 mg every 12 hrs x 7days.  Maintained on routine safety checks per protocol.  Support and encouragement offered as needed. R:  No adverse reaction noted.

## 2015-05-20 NOTE — BHH Suicide Risk Assessment (Signed)
[place] INPATIENT:  Family/Significant Other Suicide Prevention Education  Suicide Prevention Education:  Education Completed; Maria Friedman (pt's daughter) [phone] has been identified by the patient as the family member/significant other with whom the patient will be residing, and identified as the person(s) who will aid the patient in the event of a mental health crisis (suicidal ideations/suicide attempt).  With written consent from the patient, the family member/significant other has been provided the following suicide prevention education, prior to the and/or following the discharge of the patient.  The suicide prevention education provided includes the following:  Suicide risk factors  Suicide prevention and interventions  National Suicide Hotline telephone number  [facility] assessment telephone number  [facility] Emergency Assistance [place] and/or Residential Mobile Crisis Unit telephone number  Request made of family/significant other to:  Remove weapons (e.g., guns, rifles, knives), all items previously/currently identified as safety concern.    Remove drugs/medications (over-the-counter, prescriptions, illicit drugs), all items previously/currently identified as a safety concern.  The family member/significant other verbalizes understanding of the suicide prevention education information provided.  The family member/significant other agrees to remove the items of safety concern listed above.  Smart, Bellamia Ferch LCSWA 05/20/2015, 9:46 AM
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
## 2015-05-20 NOTE — Progress Notes (Signed)
[facility] MD Progress Note  05/20/2015 4:55 PM Maria Friedman  MRN:  [id number] Subjective:  Maria Friedman states that she will be ready to go home tomorrow. States that she can see how drinking adversely affects her. She is going to work to abstain. She is going to start cleaning her mothers house tomorrow. Does not think she is going to be affected as much by going over her things as she would be affected by her brother's. Yet states she would not try to hurt herself.  Principal Problem: <principal problem not specified> Diagnosis:   Patient Active Problem List   Diagnosis Date Noted  . Alcohol use disorder, severe, dependence [F10.20] 05/18/2015  . 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 Severe alcohol use disorder [F10.99] 05/18/2015  . Mood disorder [F39]    Total Time spent with patient: 30 minutes   Past Medical History:  Past Medical History  Diagnosis Date  . Seasonal allergies   . Depression   . Anxiety   . GEXBMWUX(324.4)     Past Surgical History  Procedure Laterality Date  . Foot fracture surgery  1999    2 screws in foot , R  . Vaginal delivery      x3   . Tooth extraction      total extractions, set up for dentures   . Radiology with anesthesia N/A 01/08/2013    Procedure: RADIOLOGY WITH ANESTHESIA: CEREBRAL ANGIOGRAM;  Surgeon: Rob Hickman, MD;  Location: [place];  Service: Radiology;  Laterality: N/A;   Family History:  Family History  Problem Relation Age of Onset  . COPD Mother   . Diabetes Other   . Thyroid disease Other    Social History:  History  Alcohol Use  . 0.0 oz/week    Comment: 40 oz. per day- Beer only     History  Drug Use  . 2.00 per week  . Special: Marijuana, Cocaine    Comment: crack    Social History   Social History  . Marital Status: Single    Spouse Name: N/A  . Number of Children: N/A  . Years of Education: N/A   Social History Main Topics  . Smoking status: Current Every Day Smoker -- 0.50 packs/day    Types:  Cigarettes  . Smokeless tobacco: None  . Alcohol Use: 0.0 oz/week     Comment: 40 oz. per day- Beer only  . Drug Use: 2.00 per week    Special: Marijuana, Cocaine     Comment: crack  . Sexual Activity: Not Asked   Other Topics Concern  . None   Social History Narrative   Additional History:    Sleep: Fair  Appetite:  Fair   Assessment:   Musculoskeletal: Strength & Muscle Tone: within normal limits Gait & Station: normal Patient leans: normal   Psychiatric Specialty Exam: Physical Exam  ROS  Blood pressure 120/83, pulse 90, temperature 98.3 F (36.8 C), temperature source Oral, resp. rate 18, height 5\' 3"  (1.6 m), weight 42.185 kg (93 lb), SpO2 100 %.Body mass index is 16.48 kg/(m^2).  General Appearance: Fairly Groomed  Engineer, water::  Fair  Speech:  Clear and Coherent  Volume:  Normal  Mood:  Euthymic  Affect:  Appropriate  Thought Process:  Coherent and Goal Directed  Orientation:  Full (Time, Place, and Person)  Thought Content:  symptoms events worries concerns  Suicidal Thoughts:  No  Homicidal Thoughts:  No  Memory:  Immediate;   Fair Recent;   Fair Remote;  Fair  Judgement:  Fair  Insight:  Present  Psychomotor Activity:  Normal  Concentration:  Fair  Recall:  [employer] [place]  Language: Fair  Akathisia:  No  Handed:  Right  AIMS (if indicated):     Assets:  Desire for Improvement Housing Social Support  ADL's:  Intact  Cognition: WNL  Sleep:  Number of Hours: 5.25     Current Medications: Current Facility-Administered Medications  Medication Dose Route Frequency Provider Last Rate Last Dose  . feeding supplement (ENSURE ENLIVE) (ENSURE ENLIVE) liquid 237 mL  237 mL Oral BID BM Nicholaus Bloom, MD   237 mL at 05/20/15 1038  . hydrOXYzine (ATARAX/VISTARIL) tablet 25 mg  25 mg Oral Q6H PRN Nicholaus Bloom, MD      . loperamide (IMODIUM) capsule 2-4 mg  2-4 mg Oral PRN Nicholaus Bloom, MD      . LORazepam (ATIVAN) tablet 1 mg  1 mg  Oral Q6H PRN Nicholaus Bloom, MD      . LORazepam (ATIVAN) tablet 1 mg  1 mg Oral BID Nicholaus Bloom, MD   1 mg at 05/20/15 7106   Followed by  . [START ON 05/21/2015] LORazepam (ATIVAN) tablet 1 mg  1 mg Oral Daily Nicholaus Bloom, MD      . LORazepam (ATIVAN) tablet 1 mg  1 mg Oral Once Nicholaus Bloom, MD   1 mg at 05/18/15 1616  . metroNIDAZOLE (FLAGYL) tablet 500 mg  500 mg Oral Q12H Kerrie Buffalo, NP   500 mg at 05/20/15 1518  . mirtazapine (REMERON) tablet 7.5 mg  7.5 mg Oral QHS Delfin Gant, NP   7.5 mg at 05/18/15 2151  . multivitamin with minerals tablet 1 tablet  1 tablet Oral Daily Nicholaus Bloom, MD   1 tablet at 05/20/15 0754  . ondansetron (ZOFRAN-ODT) disintegrating tablet 4 mg  4 mg Oral Q6H PRN Nicholaus Bloom, MD      . thiamine (B-1) injection 100 mg  100 mg Intramuscular Once Nicholaus Bloom, MD   100 mg at 05/18/15 1650  . thiamine (VITAMIN B-1) tablet 100 mg  100 mg Oral Daily Nicholaus Bloom, MD   100 mg at 05/19/15 0854  . traZODone (DESYREL) tablet 50 mg  50 mg Oral QHS PRN Delfin Gant, NP        Lab Results:  Results for orders placed or performed during the hospital encounter of 05/18/15 (from the past 48 hour(s))  Urinalysis, Routine w reflex microscopic (not at [facility])     Status: Abnormal   Collection Time: 05/20/15 10:40 AM  Result Value Ref Range   Color, Urine YELLOW YELLOW   APPearance CLEAR CLEAR   Specific Gravity, Urine 1.015 1.005 - 1.030   pH 7.5 5.0 - 8.0   Glucose, UA NEGATIVE NEGATIVE mg/dL   Hgb urine dipstick NEGATIVE NEGATIVE   Bilirubin Urine NEGATIVE NEGATIVE   Ketones, ur NEGATIVE NEGATIVE mg/dL   Protein, ur NEGATIVE NEGATIVE mg/dL   Urobilinogen, UA 0.2 0.0 - 1.0 mg/dL   Nitrite NEGATIVE NEGATIVE   Leukocytes, UA LARGE (A) NEGATIVE    Comment: Performed at [facility]  Urine microscopic-add on     Status: None   Collection Time: 05/20/15 10:40 AM  Result Value Ref Range   Squamous Epithelial / LPF RARE RARE   WBC,  UA 11-20 <3 WBC/hpf   RBC / HPF 0-2 <3 RBC/hpf   Bacteria,  UA RARE RARE   Urine-Other TRICHOMONAS PRESENT     Comment: Performed at [facility]    Physical Findings: AIMS: Facial and Oral Movements Muscles of Facial Expression: None, normal Lips and Perioral Area: None, normal Jaw: None, normal Tongue: None, normal,Extremity Movements Upper (arms, wrists, hands, fingers): None, normal Lower (legs, knees, ankles, toes): None, normal, Trunk Movements Neck, shoulders, hips: None, normal, Overall Severity Severity of abnormal movements (highest score from questions above): None, normal Incapacitation due to abnormal movements: None, normal Patient's awareness of abnormal movements (rate only patient's report): No Awareness, Dental Status Current problems with teeth and/or dentures?: No Does patient usually wear dentures?: No  CIWA:  CIWA-Ar Total: 0 COWS:     Treatment Plan Summary: Daily contact with patient to assess and evaluate symptoms and progress in treatment and Medication management Supportive approach/coping skills Alcohol dependence; continue the detox protocol Work a relapse prevention plan Continue to reassess for co morbidities and address them Insomnia; continue to work with the Remeron 7.5 mg HS  Medical Decision Making:  Review of Psycho-Social Stressors (1) and Review of Medication Regimen & Side Effects (2)     Dalaney Needle A 05/20/2015, 4:55 PM

## 2015-05-20 NOTE — Progress Notes (Signed)
Patient ID: Maria Friedman, female   DOB: Jul 08, 1951, 64 y.o.   MRN: [id number] D: Took over patient's care @ 2330. Patient in bed sleeping. Respiration regular and unlabored. No sign of distress noted at this time A: 15 mins checks for safety. R: Patient is safe.

## 2015-05-20 NOTE — [facility] Group Notes (Signed)
[place] LCSW Group Therapy  05/20/2015 11:45 AM  Type of Therapy:  Group Therapy  Participation Level:  Active  Participation Quality:  Attentive  Affect:  Appropriate  Cognitive:  Appropriate  Insight:  Engaged  Engagement in Therapy:  Engaged  Modes of Intervention:  Confrontation, Discussion, Education, Exploration, Problem-solving, Rapport Building, Socialization and Support  Summary of Progress/Problems: Feelings around Relapse. Group members discussed the meaning of relapse and shared personal stories of relapse, how it affected them and others, and how they perceived themselves during this time. Group members were encouraged to identify triggers, warning signs and coping skills used when facing the possibility of relapse. Social supports were discussed and explored in detail. Post Acute Withdrawal Syndrome (handout provided) was introduced and examined. Pt's were encouraged to ask questions, talk about key points associated with PAWS, and process this information in terms of relapse prevention. Maria Friedman was attentive and engaged during today's processing group. She shared that she is scared to face PAWS but acknowledged that coming up with a safety plan "would be a good idea." [name] continues to show progress in the group setting and demonstrates improving insight.   Smart, Maria Friedman LCSWA 05/20/2015, 11:45 AM

## 2015-05-20 NOTE — Progress Notes (Signed)
Adult Psychoeducational Group Note  Date:  05/20/2015 Time:  10:35 AM  Group Topic/Focus:  Diagnosis Education:   The focus of this group is to discuss the major disorders that patients maybe diagnosed with.  Group discusses the importance of knowing what one's diagnosis is so that one can understand treatment and better advocate for oneself. Relapse Prevention Planning:   The focus of this group is to define relapse and discuss the need for planning to combat relapse.  Participation Level:  Active  Participation Quality:  Appropriate  Affect:  Appropriate  Cognitive:  Appropriate  Insight: Good  Engagement in Group:  Engaged  Modes of Intervention:  Discussion  Additional Comments:  Pt discussed her personal definitio of recovery.  Pt talked about her plans for seeking help when she gets home.  Pt discussed her support system and she listed coping skills that have helped her in the past.  Maria Friedman 05/20/2015, 10:35 AM

## 2015-05-20 NOTE — Plan of Care (Signed)
Problem: Alteration in mood & ability to function due to Goal: LTG-Pt reports reduction in suicidal thoughts (Patient reports reduction in suicidal thoughts and is able to verbalize a safety plan for whenever patient is feeling suicidal)  Outcome: Progressing Pt denies SI and verbally contracts for safety.

## 2015-05-20 NOTE — Progress Notes (Signed)
Patient attended the [place] group meeting.

## 2015-05-20 NOTE — Progress Notes (Signed)
D: Pt has appropriate affect and pleasant mood.  She reports her goal is "trying to get out of here."  Pt reports "hopefully I'll leave tomorrow at 1215; I hope I can go to [place].  I'm going to try to do better, try not to drink."  Pt reports she feels safe to discharge tomorrow.  Pt denies SI/HI, denies hallucinations, denies pain.  Pt has been visible in milieu interacting with peers and staff appropriately.  Pt attended evening group.   A: Introduced self to pt.  Met with pt 1:1 and provided support and encouragement.  Actively listened to pt.  Medications offered per order.   R: Pt refused scheduled HS Remeron.  Pt verbally contracts for safety.  Will continue to monitor and assess.

## 2015-05-20 NOTE — Progress Notes (Signed)
  [facility] Adult Case Management Discharge Plan :  Will you be returning to the same living situation after discharge:  Yes,  home At discharge, do you have transportation home?: Yes,  daughter will pick her up after lunch on Saturday 05/20/15 Do you have the ability to pay for your medications: Yes,  [facility] Medicaid  Release of information consent forms completed and submitted to medical records by CSW.  Patient to Follow up at: Follow-up Information    Follow up with [facility].   Why:  Walk in between 8am-9am Monday through Friday for hospital follow-up/medication management/assessment for services.    Contact information:   [address], [place] [address] Phone: [phone] Fax: [phone]      Follow up with [facility] On 06/08/2015.   Why:  Appt with Dr. Jeanie Cooks on this date at 11:30AM for medication management. Please walk in Tuesday if you would like to be seen sooner. Thank you!   Contact information:   ATTN: Dr. Ethelene Browns [address], [place] [address] Phone: [phone] Fax: [phone]      Patient denies SI/HI: Yes,  during group/self report.     Safety Planning and Suicide Prevention discussed: Yes,  SPE completed with pt's daughter, [name].   Have you used any form of tobacco in the last 30 days? (Cigarettes, Smokeless Tobacco, Cigars, and/or Pipes): Yes  Has patient been referred to the Quitline?: N/A patient is not a smoker  Smart, [place] 05/20/2015, 9:54 AM

## 2015-05-20 NOTE — B[facility] Group Notes (Signed)
[facility] LCSW Aftercare Discharge Planning Group Note   05/20/2015 9:40 AM  Participation Quality:  Appropriate   Mood/Affect:  Appropriate  Depression Rating:  0  Anxiety Rating:  0  Thoughts of Suicide:  No Will you contract for safety?   NA  Current AVH:  No  Plan for Discharge/Comments:  Pt reports that she is feeling well today and has no withdrawal symptoms. Pt pleasant and talkative with other group members. Pt reports good sleep and good appetite. Pt hoping to d/c on Saturday. followup with PCP and with [facility].   Transportation Means: daughter, [name].   Supports: daughter, [name].   Smart, [employer]

## 2015-05-21 ENCOUNTER — Encounter (HOSPITAL_COMMUNITY): Payer: Self-pay | Admitting: Registered Nurse

## 2015-05-21 DIAGNOSIS — F1094 Alcohol use, unspecified with alcohol-induced mood disorder: Secondary | ICD-10-CM

## 2015-05-21 MED ORDER — METRONIDAZOLE 500 MG PO TABS: 500.0000 mg | ORAL_TABLET | Freq: Two times a day (BID) | ORAL | Status: AC

## 2015-05-21 MED ORDER — MIRTAZAPINE 7.5 MG PO TABS: 7.5000 mg | ORAL_TABLET | Freq: Every day | ORAL | Status: DC

## 2015-05-21 NOTE — Discharge Summary (Signed)
Physician Discharge Summary Note  Patient:  Maria Friedman is an 64 y.o., female MRN:  [id number] DOB:  10-19-1950 Patient phone:  [phone] (home)  Patient address:   [place] [address],  Total Time spent with patient: 45 minutes  Date of Admission:  05/18/2015 Date of Discharge: 05/21/2015  Reason for Admission:  Per H&P:  64 Y/O female who states that she is in the process of cleaning her mother's house. Mother died of cancer Mar 30, 2013 months ago, and then his brother died 2023-05-17. States that she has been drinking with friends. States she had stop after her mother died, but when her brother died she started drinking again. States she really needs to go back home as the house needs to be cleaned and her mother's things packed. He states he never said she was wanting to hurt herself. The initial allegations in the initial assessment are as follows:    Maria Friedman is an African-American, divorced, 64 y.o. female with hx of depression and anxiety presenting to [facility] via [facility] under IVC. Pt presents with slightly irritable mood and congruent affect. Eye-contact is good, thought processes are logical and linear, and speech is clear and relevant. Concentration is good and pt is oriented x4. She is slightly guarded at times but is cooperative with interview.   Principal Problem: Alcohol-induced mood disorder Discharge Diagnoses: Patient Active Problem List   Diagnosis Date Noted  . Alcohol use disorder, severe, dependence [F10.20] 05/18/2015  . 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 Severe alcohol use disorder [F10.99] 05/18/2015  . Mood disorder [F39]     Musculoskeletal: Strength & Muscle Tone: within normal limits Gait & Station: normal Patient leans: N/A  Psychiatric Specialty Exam: See Suicide Risk Assessment Physical Exam  Nursing note and vitals reviewed. Constitutional: She is oriented to person, place, and time.  Neck: Normal range of  motion.  Respiratory: Effort normal.  Genitourinary:  BV, and Trichomoniasis   Musculoskeletal: Normal range of motion.  Neurological: She is alert and oriented to person, place, and time.    Review of Systems  Psychiatric/Behavioral: Positive for substance abuse. Negative for suicidal ideas and hallucinations. Depression: Stable. Nervous/anxious: Stable. Insomnia: Stable.   All other systems reviewed and are negative.   Blood pressure 109/79, pulse 98, temperature 97.9 F (36.6 C), temperature source Oral, resp. rate 16, height 5\' 3"  (1.6 m), weight 42.185 kg (93 lb), SpO2 100 %.Body mass index is 16.48 kg/(m^2).  Have you used any form of tobacco in the last 30 days? (Cigarettes, Smokeless Tobacco, Cigars, and/or Pipes): Yes  Has this patient used any form of tobacco in the last 30 days? (Cigarettes, Smokeless Tobacco, Cigars, and/or Pipes) Yes, A prescription for an FDA-approved tobacco cessation medication was offered at discharge and the patient refused  Past Medical History:  Past Medical History  Diagnosis Date  . Seasonal allergies   . Depression   . Anxiety   . GYFVCBSW(967.5)     Past Surgical History  Procedure Laterality Date  . Foot fracture surgery  1999    2 screws in foot , R  . Vaginal delivery      x3   . Tooth extraction      total extractions, set up for dentures   . Radiology with anesthesia N/A 01/08/2013    Procedure: RADIOLOGY WITH ANESTHESIA: CEREBRAL ANGIOGRAM;  Surgeon: Rob Hickman, MD;  Location: [place];  Service: Radiology;  Laterality: N/A;   Family  History:  Family History  Problem Relation Age of Onset  . COPD Mother   . Diabetes Other   . Thyroid disease Other    Social History:  History  Alcohol Use  . 0.0 oz/week    Comment: 40 oz. per day- Beer only     History  Drug Use  . 2.00 per week  . Special: Marijuana, Cocaine    Comment: crack    Social History   Social History  . Marital Status: Single    Spouse Name: N/A   . Number of Children: N/A  . Years of Education: N/A   Social History Main Topics  . Smoking status: Current Every Day Smoker -- 0.50 packs/day    Types: Cigarettes  . Smokeless tobacco: None  . Alcohol Use: 0.0 oz/week     Comment: 40 oz. per day- Beer only  . Drug Use: 2.00 per week    Special: Marijuana, Cocaine     Comment: crack  . Sexual Activity: Not Asked   Other Topics Concern  . None   Social History Narrative    Risk to Self: Is patient at risk for suicide?: Yes What has been your use of drugs/alcohol within the last 12 months?:  (Pt describes her alcohol use as "social drinking". She explains her drinking pattern is to drink a fifth of whiskey with 3 friends about 2x per week. Pt statest the only substance she uses regularly is alcohol . THC and cocaine use is infrequent.) Risk to Others:   Prior Inpatient Therapy:   Prior Outpatient Therapy:    Level of Care:  OP  Hospital Course:  Jaclynn Laumann was admitted for Alcohol-induced mood disorder and crisis management.  She was treated discharged with the medications listed below under Medication List.  Medical problems were identified and treated as needed.  Home medications were restarted as appropriate.  Improvement was monitored by observation and [name] daily report of symptom reduction.  Emotional and mental status was monitored by daily self-inventory reports completed by [name] and clinical staff.         Mairlyn Barro was evaluated by the treatment team for stability and plans for continued recovery upon discharge.  Lawana Aye motivation was an integral factor for scheduling further treatment.  Employment, transportation, bed availability, health status, family support, and any pending legal issues were also considered during her hospital stay.  She was offered further treatment options upon discharge including but not limited to Residential, Intensive Outpatient, and Outpatient treatment.  Quentina  Haverstick will follow up with the services as listed below under Follow Up Information.     Upon completion of this admission the patient was both mentally and medically stable for discharge denying suicidal/homicidal ideation, auditory/visual/tactile hallucinations, delusional thoughts and paranoia.      Consults:  psychiatry  Significant Diagnostic Studies:  labs: Reviewed  Discharge Vitals:   Blood pressure 109/79, pulse 98, temperature 97.9 F (36.6 C), temperature source Oral, resp. rate 16, height 5\' 3"  (1.6 m), weight 42.185 kg (93 lb), SpO2 100 %. Body mass index is 16.48 kg/(m^2). Lab Results:   Results for orders placed or performed during the hospital encounter of 05/18/15 (from the past 72 hour(s))  Urinalysis, Routine w reflex microscopic (not at [facility])     Status: Abnormal   Collection Time: 05/20/15 10:40 AM  Result Value Ref Range   Color, Urine YELLOW YELLOW   APPearance CLEAR CLEAR   Specific Gravity, Urine 1.015 1.005 - 1.030  pH 7.5 5.0 - 8.0   Glucose, UA NEGATIVE NEGATIVE mg/dL   Hgb urine dipstick NEGATIVE NEGATIVE   Bilirubin Urine NEGATIVE NEGATIVE   Ketones, ur NEGATIVE NEGATIVE mg/dL   Protein, ur NEGATIVE NEGATIVE mg/dL   Urobilinogen, UA 0.2 0.0 - 1.0 mg/dL   Nitrite NEGATIVE NEGATIVE   Leukocytes, UA LARGE (A) NEGATIVE    Comment: Performed at [facility]  Urine microscopic-add on     Status: None   Collection Time: 05/20/15 10:40 AM  Result Value Ref Range   Squamous Epithelial / LPF RARE RARE   WBC, UA 11-20 <3 WBC/hpf   RBC / HPF 0-2 <3 RBC/hpf   Bacteria, UA RARE RARE   Urine-Other TRICHOMONAS PRESENT     Comment: Performed at [facility]    Physical Findings: AIMS: Facial and Oral Movements Muscles of Facial Expression: None, normal Lips and Perioral Area: None, normal Jaw: None, normal Tongue: None, normal,Extremity Movements Upper (arms, wrists, hands, fingers): None, normal Lower (legs, knees,  ankles, toes): None, normal, Trunk Movements Neck, shoulders, hips: None, normal, Overall Severity Severity of abnormal movements (highest score from questions above): None, normal Incapacitation due to abnormal movements: None, normal Patient's awareness of abnormal movements (rate only patient's report): No Awareness, Dental Status Current problems with teeth and/or dentures?: No Does patient usually wear dentures?: No  CIWA:  CIWA-Ar Total: 0 COWS:      See Psychiatric Specialty Exam and Suicide Risk Assessment completed by Attending Physician prior to discharge.  Discharge destination:  Home  Is patient on multiple antipsychotic therapies at discharge:  No   Has Patient had three or more failed trials of antipsychotic monotherapy by history:  No    Recommended Plan for Multiple Antipsychotic Therapies: NA      Discharge Instructions    Activity as tolerated - No restrictions    Complete by:  As directed      Diet general    Complete by:  As directed      Discharge instructions    Complete by:  As directed   Take all of you medications as prescribed by your mental healthcare provider.  Report any adverse effects and reactions from your medications to your outpatient provider promptly. Do not engage in alcohol and or illegal drug use while on prescription medicines. In the event of worsening symptoms call the crisis hotline, 911, and or go to the nearest emergency department for appropriate evaluation and treatment of symptoms. Follow-up with your primary care provider for your medical issues, concerns and or health care needs.   Keep all scheduled appointments.  If you are unable to keep an appointment call to reschedule.  Let the nurse know if you will need medications before next scheduled appointment.            Medication List    STOP taking these medications        gabapentin 300 MG capsule  Commonly known as:  NEURONTIN     HYDROcodone-acetaminophen 5-325 MG  per tablet  Commonly known as:  NORCO/VICODIN      TAKE these medications      Indication   diclofenac 75 MG EC tablet  Commonly known as:  VOLTAREN  Take 75 mg by mouth 2 (two) times daily.      fluticasone 50 MCG/ACT nasal spray  Commonly known as:  FLONASE  Place 2 sprays into the nose daily as needed for rhinitis.      metroNIDAZOLE 500  MG tablet  Commonly known as:  FLAGYL  Take 1 tablet (500 mg total) by mouth every 12 (twelve) hours.   Indication:  Vaginosis caused by Bacteria, Infection caused by Trichomoniasis Protozoa     mirtazapine 7.5 MG tablet  Commonly known as:  REMERON  Take 1 tablet (7.5 mg total) by mouth at bedtime.   Indication:  Trouble Sleeping, Major Depressive Disorder       Follow-up Information    Follow up with [facility].   Why:  Walk in between 8am-9am Monday through Friday for hospital follow-up/medication management/assessment for services.    Contact information:   [address], [place] [address] Phone: [phone] Fax: [phone]      Follow up with [facility] On 06/08/2015.   Why:  Appt with Dr. Jeanie Cooks on this date at 11:30AM for medication management. Please walk in Tuesday if you would like to be seen sooner. Thank you!   Contact information:   ATTN: Dr. Ethelene Browns [place],  [address] Phone: [phone] Fax: [phone]      Follow-up recommendations:  Activity:  As tolerated Diet:  As tolerated  Comments:   Patient has been instructed to take medications as prescribed; and report adverse effects to outpatient provider.  Follow up with primary doctor for any medical issues and If symptoms recur report to nearest emergency or crisis hot line.    Total Discharge Time: 45 minutes  Signed: Earleen Newport, FNP-BC 05/21/2015, 12:26 PM   Patient seen chart reviewed.  She is taking her medication without any side effects.  She denies any suicidal thoughts or homicidal thought.  She was to  continue her medication upon discharge.  She has no psychosis or any hallucination.I agreed with the findings, treatment and disposition plan of this patient.  Berniece Andreas, MD

## 2015-05-21 NOTE — BHH Suicide Risk Assessment (Signed)
[facility] Discharge Suicide Risk Assessment   Demographic Factors:  Living alone  Total Time spent with patient: 30 minutes  Musculoskeletal: Strength & Muscle Tone: within normal limits Gait & Station: normal Patient leans: N/A  Psychiatric Specialty Exam: Physical Exam  ROS  Blood pressure 109/79, pulse 98, temperature 97.9 F (36.6 C), temperature source Oral, resp. rate 16, height 5\' 3"  (1.6 m), weight 42.185 kg (93 lb), SpO2 100 %.Body mass index is 16.48 kg/(m^2).  General Appearance: Casual  Eye Contact::  Good  Speech:  Slow409  Volume:  Normal  Mood:  Euthymic  Affect:  Congruent  Thought Process:  Coherent  Orientation:  Full (Time, Place, and Person)  Thought Content:  WDL  Suicidal Thoughts:  No  Homicidal Thoughts:  No  Memory:  Immediate;   Fair  Judgement:  Good  Insight:  Good  Psychomotor Activity:  Normal  Concentration:  Good  Recall:  Good  Fund of Knowledge:Good  Language: Good  Akathisia:  No  Handed:  Right  AIMS (if indicated):     Assets:  Communication Skills Desire for Improvement Financial Resources/Insurance Housing Physical Health Social Support  Sleep:  Number of Hours: 6  Cognition: WNL  ADL's:  Intact   Have you used any form of tobacco in the last 30 days? (Cigarettes, Smokeless Tobacco, Cigars, and/or Pipes): Yes  Has this patient used any form of tobacco in the last 30 days? (Cigarettes, Smokeless Tobacco, Cigars, and/or Pipes) No  Mental Status Per Nursing Assessment::   On Admission:  NA  Current Mental Status by Physician: See above  Loss Factors: NA  Historical Factors: Family history of mental illness or substance abuse  Risk Reduction Factors:   Responsible for children under 74 years of age, Sense of responsibility to family, Religious beliefs about death, Living with another person, especially a relative, Positive social support, Positive therapeutic relationship and Positive coping skills or problem solving  skills  Continued Clinical Symptoms:  Alcohol/Substance Abuse/Dependencies  Cognitive Features That Contribute To Risk:  None    Suicide Risk:  Minimal: No identifiable suicidal ideation.  Patients presenting with no risk factors but with morbid ruminations; may be classified as minimal risk based on the severity of the depressive symptoms  Principal Problem: <principal problem not specified> Discharge Diagnoses:  Patient Active Problem List   Diagnosis Date Noted  . Alcohol use disorder, severe, dependence [F10.20] 05/18/2015  . 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 Severe alcohol use disorder [F10.99] 05/18/2015  . Mood disorder [F39]     Follow-up Information    Follow up with [facility].   Why:  Walk in between 8am-9am Monday through Friday for hospital follow-up/medication management/assessment for services.    Contact information:   [address], [place] [address] Phone: [phone] Fax: [phone]      Follow up with [facility] On 06/08/2015.   Why:  Appt with Dr. Jeanie Cooks on this date at 11:30AM for medication management. Please walk in Tuesday if you would like to be seen sooner. Thank you!   Contact information:   ATTN: Dr. Ethelene Browns [place], [place] [address] Phone: [phone] Fax: [phone]      Plan Of Care/Follow-up recommendations:  Activity:  As tolerated Diet:  Unchanged from past  Is patient on multiple antipsychotic therapies at discharge:  No   Has Patient had three or more failed trials of antipsychotic monotherapy by history:  No  Recommended Plan for Multiple Antipsychotic Therapies: NA  Mykaela Arena T. 05/21/2015, 9:50 AM

## 2015-05-21 NOTE — Progress Notes (Signed)
Patient is calm and pleasant.  Denies suicidal ideation.  Patient discharge home with medication samples and prescriptions.  Discharge instructions and prescriptions reviewed with patient.  Verbalizes understanding of discharge readiness.  No adverse reaction noted from antibiotic therapy.  Patient maintained on routine safety checks per protocol until discharge.  Personal belongings returned to patient.  Patient report no complain or distress.

## 2015-05-21 NOTE — BHH Group Notes (Signed)
[facility]  05/21/2015   10 AM  Type of Therapy:  Group Therapy  Participation Level:  Minimal  Participation Quality:  Attentive  Affect:  Flat  Cognitive:  Alert and Oriented  Insight:  None shared   Engagement in Therapy:  Limited  Modes of Intervention:  Discussion, Exploration, Rapport Building and Support   Summary of Progress/Problems: The main focus of today's process group was for the patient to identify ways in which they have in the past sabotaged their own recovery. Motivational Interviewing was utilized to ask the group members what they get out of their substance use, and what reasons they may have for wanting to change. The Stages of Change were explained using a handout, and patients identified where they currently are with regard to stages of change. Patient remained in group room for half of session although she complained that "social workers need to help this boy get some glasses verses all this talking." Patient shared identification with poor self care and shared that she is much more in tune with needs of others verses her own needs.    Lyla Glassing

## 2015-12-22 ENCOUNTER — Other Ambulatory Visit: Payer: Self-pay | Admitting: Internal Medicine

## 2015-12-22 DIAGNOSIS — E2839 Other primary ovarian failure: Secondary | ICD-10-CM

## 2016-01-17 ENCOUNTER — Ambulatory Visit
Admission: RE | Admit: 2016-01-17 | Discharge: 2016-01-17 | Disposition: A | Discharge status: Home / Self Care | Payer: Medicaid Other | Source: Ambulatory Visit

## 2016-01-17 ENCOUNTER — Ambulatory Visit
Admission: RE | Admit: 2016-01-17 | Discharge: 2016-01-17 | Disposition: A | Discharge status: Home / Self Care | Payer: Medicaid Other | Source: Ambulatory Visit | Attending: Internal Medicine | Admitting: Internal Medicine

## 2016-01-17 DIAGNOSIS — E2839 Other primary ovarian failure: Secondary | ICD-10-CM

## 2016-01-17 DIAGNOSIS — Z1231 Encounter for screening mammogram for malignant neoplasm of breast: Secondary | ICD-10-CM

## 2016-01-19 ENCOUNTER — Other Ambulatory Visit: Payer: Self-pay | Admitting: Internal Medicine

## 2016-01-19 DIAGNOSIS — R928 Other abnormal and inconclusive findings on diagnostic imaging of breast: Secondary | ICD-10-CM

## 2016-01-27 ENCOUNTER — Ambulatory Visit
Admission: RE | Admit: 2016-01-27 | Discharge: 2016-01-27 | Disposition: A | Discharge status: Home / Self Care | Payer: Medicaid Other | Source: Ambulatory Visit | Attending: Internal Medicine | Admitting: Internal Medicine

## 2016-01-27 DIAGNOSIS — R928 Other abnormal and inconclusive findings on diagnostic imaging of breast: Secondary | ICD-10-CM

## 2016-05-09 ENCOUNTER — Encounter: Payer: Self-pay | Admitting: Gastroenterology

## 2016-06-27 ENCOUNTER — Other Ambulatory Visit: Payer: Self-pay | Admitting: Internal Medicine

## 2016-06-27 DIAGNOSIS — N6320 Unspecified lump in the left breast, unspecified quadrant: Secondary | ICD-10-CM

## 2016-07-30 ENCOUNTER — Ambulatory Visit
Admission: RE | Admit: 2016-07-30 | Discharge: 2016-07-30 | Disposition: A | Discharge status: Home / Self Care | Payer: Medicaid Other | Source: Ambulatory Visit | Attending: Internal Medicine | Admitting: Internal Medicine

## 2016-07-30 DIAGNOSIS — N6320 Unspecified lump in the left breast, unspecified quadrant: Secondary | ICD-10-CM

## 2016-07-31 ENCOUNTER — Encounter: Payer: Self-pay | Admitting: Gastroenterology

## 2016-07-31 ENCOUNTER — Ambulatory Visit: Payer: Self-pay | Admitting: Podiatry

## 2016-08-03 ENCOUNTER — Encounter: Payer: Self-pay | Admitting: Podiatry

## 2016-08-03 ENCOUNTER — Ambulatory Visit: Payer: Medicaid Other

## 2016-08-03 ENCOUNTER — Ambulatory Visit (INDEPENDENT_AMBULATORY_CARE_PROVIDER_SITE_OTHER): Payer: Medicaid Other | Admitting: Podiatry

## 2016-08-03 DIAGNOSIS — R52 Pain, unspecified: Secondary | ICD-10-CM

## 2016-08-03 DIAGNOSIS — D492 Neoplasm of unspecified behavior of bone, soft tissue, and skin: Secondary | ICD-10-CM

## 2016-08-03 DIAGNOSIS — Q828 Other specified congenital malformations of skin: Secondary | ICD-10-CM

## 2016-08-03 DIAGNOSIS — M205X2 Other deformities of toe(s) (acquired), left foot: Secondary | ICD-10-CM

## 2016-08-03 MED ORDER — NONFORMULARY OR COMPOUNDED ITEM: 2 refills | Status: DC

## 2016-08-03 NOTE — Progress Notes (Signed)
   Subjective:    Patient ID: Maria Friedman, female    DOB: 02/07/1951, 65 y.o.   MRN: [id number]  HPI  65 year old female presents the office today for concerns of ingrown toenail to the left fifth toe. She states the areas painful to take the pressure in shoe gear. She said no recent treatment for this. Denies any redness or drainage or any swelling. There is tender with pressure in shoe gear. She states that she is walking on the fifth toe and a more pressure to this area. No other complaints at this time.  Review of Systems  HENT: Positive for sinus pressure.   All other systems reviewed and are negative.      Objective:   Physical Exam General: AAO x3, NAD  Dermatological: There is hyperkeratotic lesion on the lateral aspect the left fifth digit toenail with US of the majority of her symptoms are localized. Upon debridement there is indeed, a neck, punctate hyperkeratotic lesion over the area tenderness. There is no identifiable foreign body identified. There is no drainage or pus or any underlying ulceration. There is no snapping incurvation the toenail and her symptoms is mostly due to work of the hyperkeratotic lesion due to the digital deformity. There is mild dystrophy discoloration with yellow discoloration to the toenails. There is no tenderness the other toenails.  Vascular: Dorsalis Pedis artery and Posterior Tibial artery pedal pulses are 2/4 bilateral with immedate capillary fill time.  There is no pain with calf compression, swelling, warmth, erythema.   Neruologic: Grossly intact via light touch bilateral. Vibratory intact via tuning fork bilateral. Protective threshold with Semmes Wienstein monofilament intact to all pedal sites bilateral.   Musculoskeletal: Adductovarus is present in the left fifth toe.  Muscular strength 5/5 in all groups tested bilateral.  Gait: Unassisted, Nonantalgic.      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 -Treatment options discussed including all alternatives, risks, and complications -Etiology of symptoms were discussed -Lesion was shelled debrided without complications or bleeding. Bleeders symptoms are mostly due to the porokeratosis post-the ingrown toenail. After the area was debrided the area was cleaned and a pad was placed over salicylic acid and a bandage. Post procedure instructions were discussed. Monitor for any infection. Discussed reoccurrence. Offloading pads were dispensed as well.  Celesta Gentile, DPM

## 2016-08-23 ENCOUNTER — Ambulatory Visit (AMBULATORY_SURGERY_CENTER): Payer: Self-pay | Admitting: *Deleted

## 2016-08-23 VITALS — Ht 64.0 in | Wt 96.0 lb

## 2016-08-23 DIAGNOSIS — Z1211 Encounter for screening for malignant neoplasm of colon: Secondary | ICD-10-CM

## 2016-08-23 MED ORDER — NA SULFATE-K SULFATE-MG SULF 17.5-3.13-1.6 GM/177ML PO SOLN: 1.0000 | Freq: Once | ORAL | 0 refills | Status: AC

## 2016-08-23 NOTE — Progress Notes (Signed)
No egg or soy allergy known to patient  No issues with past sedation with any surgeries  or procedures, no intubation problems  No diet pills per patient No home 02 use per patient  No blood thinners per patient  Pt denies issues with constipation  No A fib or A flutter   No e mail for emmi video

## 2016-09-06 ENCOUNTER — Encounter: Payer: Self-pay | Admitting: Gastroenterology

## 2016-09-06 ENCOUNTER — Ambulatory Visit (AMBULATORY_SURGERY_CENTER): Payer: Medicaid Other | Admitting: Gastroenterology

## 2016-09-06 VITALS — BP 122/77 | HR 80 | Temp 98.9°F | Resp 16 | Ht 64.0 in | Wt 96.0 lb

## 2016-09-06 DIAGNOSIS — Z1212 Encounter for screening for malignant neoplasm of rectum: Secondary | ICD-10-CM

## 2016-09-06 DIAGNOSIS — Z1211 Encounter for screening for malignant neoplasm of colon: Secondary | ICD-10-CM | POA: Diagnosis not present

## 2016-09-06 DIAGNOSIS — D122 Benign neoplasm of ascending colon: Secondary | ICD-10-CM | POA: Diagnosis not present

## 2016-09-06 DIAGNOSIS — K635 Polyp of colon: Secondary | ICD-10-CM | POA: Diagnosis not present

## 2016-09-06 DIAGNOSIS — D123 Benign neoplasm of transverse colon: Secondary | ICD-10-CM

## 2016-09-06 DIAGNOSIS — D125 Benign neoplasm of sigmoid colon: Secondary | ICD-10-CM | POA: Diagnosis not present

## 2016-09-06 MED ORDER — SODIUM CHLORIDE 0.9 % IV SOLN: 500.0000 mL | INTRAVENOUS | Status: DC

## 2016-09-06 NOTE — Progress Notes (Signed)
Called to room to assist during endoscopic procedure.  Patient ID and intended procedure confirmed with present staff. Received instructions for my participation in the procedure from the performing physician.  

## 2016-09-06 NOTE — Progress Notes (Signed)
A and O x3. Report to RN. Tolerated MAC anesthesia well. 

## 2016-09-06 NOTE — Patient Instructions (Signed)
Discharge instructions given. Handouts on polyps and diverticulosis. No ibuprofen,naproxen,or other anti-inflammatory drugs for 2 weeks. Resume previous medications. YOU HAD AN ENDOSCOPIC PROCEDURE TODAY AT [facility]:   Refer to the procedure report that was given to you for any specific questions about what was found during the examination.  If the procedure report does not answer your questions, please call your gastroenterologist to clarify.  If you requested that your care partner not be given the details of your procedure findings, then the procedure report has been included in a sealed envelope for you to review at your convenience later.  YOU SHOULD EXPECT: Some feelings of bloating in the abdomen. Passage of more gas than usual.  Walking can help get rid of the air that was put into your GI tract during the procedure and reduce the bloating. If you had a lower endoscopy (such as a colonoscopy or flexible sigmoidoscopy) you may notice spotting of blood in your stool or on the toilet paper. If you underwent a bowel prep for your procedure, you may not have a normal bowel movement for a few days.  Please Note:  You might notice some irritation and congestion in your nose or some drainage.  This is from the oxygen used during your procedure.  There is no need for concern and it should clear up in a day or so.  SYMPTOMS TO REPORT IMMEDIATELY:   Following lower endoscopy (colonoscopy or flexible sigmoidoscopy):  Excessive amounts of blood in the stool  Significant tenderness or worsening of abdominal pains  Swelling of the abdomen that is new, acute  Fever of 100F or higher   For urgent or emergent issues, a gastroenterologist can be reached at any hour by calling [phone].   DIET:  We do recommend a small meal at first, but then you may proceed to your regular diet.  Drink plenty of fluids but you should avoid alcoholic beverages for 24 hours.  ACTIVITY:  You  should plan to take it easy for the rest of today and you should NOT DRIVE or use heavy machinery until tomorrow (because of the sedation medicines used during the test).    FOLLOW UP: Our staff will call the number listed on your records the next business day following your procedure to check on you and address any questions or concerns that you may have regarding the information given to you following your procedure. If we do not reach you, we will leave a message.  However, if you are feeling well and you are not experiencing any problems, there is no need to return our call.  We will assume that you have returned to your regular daily activities without incident.  If any biopsies were taken you will be contacted by phone or by letter within the next 1-3 weeks.  Please call us at [phone] if you have not heard about the biopsies in 3 weeks.    SIGNATURES/CONFIDENTIALITY: You and/or your care partner have signed paperwork which will be entered into your electronic medical record.  These signatures attest to the fact that that the information above on your After Visit Summary has been reviewed and is understood.  Full responsibility of the confidentiality of this discharge information lies with you and/or your care-partner.

## 2016-09-06 NOTE — Op Note (Signed)
[facility] Patient Name: Maria Friedman Procedure Date: 09/06/2016 2:33 PM MRN: [id number] Endoscopist: Remo Lipps P. Armbruster MD, MD Age: 65 Referring MD:  Date of Birth: 11-28-1950 Gender: Female Account #: [id number] Procedure:                Colonoscopy Indications:              Screening for colorectal malignant neoplasm, This                            is the patient's first colonoscopy Medicines:                Monitored Anesthesia Care Procedure:                Pre-Anesthesia Assessment:                           - Prior to the procedure, a History and Physical                            was performed, and patient medications and                            allergies were reviewed. The patient's tolerance of                            previous anesthesia was also reviewed. The risks                            and benefits of the procedure and the sedation                            options and risks were discussed with the patient.                            All questions were answered, and informed consent                            was obtained. Prior Anticoagulants: The patient has                            taken no previous anticoagulant or antiplatelet                            agents. ASA Grade Assessment: II - A patient with                            mild systemic disease. After reviewing the risks                            and benefits, the patient was deemed in                            satisfactory condition to undergo the procedure.  After obtaining informed consent, the colonoscope                            was passed under direct vision. Throughout the                            procedure, the patient's blood pressure, pulse, and                            oxygen saturations were monitored continuously. The                            Model PCF-H190DL [id number]) scope was introduced                            through the anus  and advanced to the the cecum,                            identified by appendiceal orifice and ileocecal                            valve. The colonoscopy was performed without                            difficulty. The patient tolerated the procedure                            well. The quality of the bowel preparation was                            good. The ileocecal valve, appendiceal orifice, and                            rectum were photographed. Scope In: 2:42:50 PM Scope Out: 3:00:46 PM Scope Withdrawal Time: 0 hours 14 minutes 13 seconds  Total Procedure Duration: 0 hours 17 minutes 56 seconds  Findings:                 The perianal and digital rectal examinations were                            normal.                           Three flat polyps were found in the ascending                            colon. The polyps were 4 to 5 mm in size. These                            polyps were removed with a cold snare. Resection                            and retrieval were complete.  A 6 mm polyp was found in the sigmoid colon. The                            polyp was flat. The polyp was removed with a cold                            snare. Resection and retrieval were complete.                           A few small-mouthed diverticula were found in the                            sigmoid colon.                           The exam was otherwise without abnormality on                            direct and retroflexion views. The colon was                            tortous. Complications:            No immediate complications. Estimated blood loss:                            Minimal. Estimated Blood Loss:     Estimated blood loss was minimal. Impression:               - Three 4 to 5 mm polyps in the ascending colon,                            removed with a cold snare. Resected and retrieved.                           - One 6 mm polyp in the sigmoid colon,  removed with                            a cold snare. Resected and retrieved.                           - Diverticulosis in the sigmoid colon.                           - The examination was otherwise normal on direct                            and retroflexion views. Recommendation:           - Patient has a contact number available for                            emergencies. The signs and symptoms of potential  delayed complications were discussed with the                            patient. Return to normal activities tomorrow.                            Written discharge instructions were provided to the                            patient.                           - Resume previous diet.                           - Continue present medications.                           - No ibuprofen, naproxen, or other non-steroidal                            anti-inflammatory drugs for 2 weeks after polyp                            removal.                           - Await pathology results.                           - Repeat colonoscopy is recommended for                            surveillance. The colonoscopy date will be                            determined after pathology results from today's                            exam become available for review. Remo Lipps P. Armbruster MD, MD 09/06/2016 3:05:13 PM This report has been signed electronically.

## 2016-09-07 ENCOUNTER — Telehealth: Payer: Self-pay

## 2016-09-07 ENCOUNTER — Telehealth: Payer: Self-pay | Admitting: *Deleted

## 2016-09-07 NOTE — Telephone Encounter (Signed)
No answer. No identifier. Message left to call if any questions or concerns.

## 2016-09-07 NOTE — Telephone Encounter (Signed)
No name or number identifier. Left message we will call back later today.

## 2016-09-21 ENCOUNTER — Encounter: Payer: Self-pay | Admitting: Gastroenterology

## 2016-10-11 ENCOUNTER — Other Ambulatory Visit: Payer: Self-pay | Admitting: Internal Medicine

## 2016-10-11 DIAGNOSIS — E2839 Other primary ovarian failure: Secondary | ICD-10-CM

## 2017-03-25 ENCOUNTER — Other Ambulatory Visit: Payer: Self-pay | Admitting: Internal Medicine

## 2017-03-25 DIAGNOSIS — N6320 Unspecified lump in the left breast, unspecified quadrant: Secondary | ICD-10-CM

## 2017-04-08 ENCOUNTER — Other Ambulatory Visit: Payer: Self-pay

## 2017-04-15 ENCOUNTER — Ambulatory Visit
Admission: RE | Admit: 2017-04-15 | Discharge: 2017-04-15 | Disposition: A | Discharge status: Home / Self Care | Payer: Medicaid Other | Source: Ambulatory Visit | Attending: Internal Medicine | Admitting: Internal Medicine

## 2017-04-15 DIAGNOSIS — N6320 Unspecified lump in the left breast, unspecified quadrant: Secondary | ICD-10-CM

## 2017-05-01 ENCOUNTER — Encounter: Payer: Self-pay | Admitting: Obstetrics and Gynecology

## 2017-06-07 ENCOUNTER — Encounter: Payer: Self-pay | Admitting: Obstetrics & Gynecology

## 2018-03-06 ENCOUNTER — Other Ambulatory Visit: Payer: Self-pay | Admitting: Internal Medicine

## 2018-03-06 ENCOUNTER — Other Ambulatory Visit: Payer: Self-pay

## 2018-03-06 DIAGNOSIS — N6002 Solitary cyst of left breast: Secondary | ICD-10-CM

## 2018-04-16 ENCOUNTER — Other Ambulatory Visit: Payer: Self-pay

## 2018-04-24 ENCOUNTER — Ambulatory Visit
Admission: RE | Admit: 2018-04-24 | Discharge: 2018-04-24 | Disposition: A | Discharge status: Home / Self Care | Payer: Medicare Other | Source: Ambulatory Visit | Attending: Internal Medicine | Admitting: Internal Medicine

## 2018-04-24 DIAGNOSIS — N6002 Solitary cyst of left breast: Secondary | ICD-10-CM

## 2019-04-01 ENCOUNTER — Other Ambulatory Visit: Payer: Self-pay | Admitting: Internal Medicine

## 2019-04-01 DIAGNOSIS — Z1231 Encounter for screening mammogram for malignant neoplasm of breast: Secondary | ICD-10-CM

## 2019-05-15 ENCOUNTER — Ambulatory Visit: Payer: Medicare Other

## 2019-07-22 ENCOUNTER — Other Ambulatory Visit: Payer: Self-pay

## 2019-07-22 ENCOUNTER — Ambulatory Visit
Admission: RE | Admit: 2019-07-22 | Discharge: 2019-07-22 | Disposition: A | Discharge status: Home / Self Care | Payer: Medicare Other | Source: Ambulatory Visit | Attending: Internal Medicine | Admitting: Internal Medicine

## 2019-07-22 DIAGNOSIS — Z1231 Encounter for screening mammogram for malignant neoplasm of breast: Secondary | ICD-10-CM

## 2019-08-06 ENCOUNTER — Ambulatory Visit: Payer: Medicare Other | Admitting: Podiatry

## 2019-11-13 ENCOUNTER — Ambulatory Visit: Payer: Medicare Other

## 2019-11-19 ENCOUNTER — Ambulatory Visit: Payer: Medicare Other | Attending: Internal Medicine

## 2019-11-19 DIAGNOSIS — Z23 Encounter for immunization: Secondary | ICD-10-CM | POA: Insufficient documentation

## 2019-11-19 NOTE — Progress Notes (Signed)
   Covid-19 Vaccination Clinic  Name:  Cesily Costenbader    MRN: [id number] DOB: 1951-01-17  11/19/2019  Ms. Shiffler was observed post Covid-19 immunization for 15 minutes without incident. She was provided with Vaccine Information Sheet and instruction to access the V-Safe system.   Ms. Cragun was instructed to call 911 with any severe reactions post vaccine: Marland Kitchen Difficulty breathing  . Swelling of face and throat  . A fast heartbeat  . A bad rash all over body  . Dizziness and weakness   Immunizations Administered    Name Date Dose VIS Date Route   Pfizer COVID-19 Vaccine 11/19/2019  9:00 AM 0.3 mL 08/28/2019 Intramuscular   Manufacturer: [place]   Lot: [id number]   [place]: [id number]

## 2019-12-15 ENCOUNTER — Ambulatory Visit: Payer: Medicare Other

## 2019-12-22 ENCOUNTER — Ambulatory Visit: Payer: Medicare Other | Attending: Internal Medicine

## 2019-12-22 DIAGNOSIS — Z23 Encounter for immunization: Secondary | ICD-10-CM

## 2019-12-22 NOTE — Progress Notes (Signed)
   Covid-19 Vaccination Clinic  Name:  Maria Friedman    MRN: [id number] DOB: [birth year]  12/22/2019  Maria Friedman was observed post Covid-19 immunization for 15 minutes without incident. She was provided with Vaccine Information Sheet and instruction to access the V-Safe system.   Maria Friedman was instructed to call 911 with any severe reactions post vaccine: Marland Kitchen Difficulty breathing  . Swelling of face and throat  . A fast heartbeat  . A bad rash all over body  . Dizziness and weakness   Immunizations Administered    Name Date Dose VIS Date Route   Pfizer COVID-19 Vaccine 12/22/2019  9:30 AM 0.3 mL 08/28/2019 Intramuscular   Manufacturer: [employer]   Lot: [id number]   [place]: [id number]

## 2020-03-11 ENCOUNTER — Other Ambulatory Visit (HOSPITAL_COMMUNITY): Payer: Self-pay | Admitting: Orthopedic Surgery

## 2020-03-16 ENCOUNTER — Other Ambulatory Visit: Payer: Self-pay

## 2020-03-16 ENCOUNTER — Encounter (HOSPITAL_BASED_OUTPATIENT_CLINIC_OR_DEPARTMENT_OTHER): Payer: Self-pay | Admitting: Orthopedic Surgery

## 2020-03-22 ENCOUNTER — Other Ambulatory Visit (HOSPITAL_COMMUNITY)
Admission: RE | Admit: 2020-03-22 | Discharge: 2020-03-22 | Disposition: A | Discharge status: Home / Self Care | Payer: Medicare Other | Source: Ambulatory Visit | Attending: Orthopedic Surgery | Admitting: Orthopedic Surgery

## 2020-03-22 DIAGNOSIS — Z20822 Contact with and (suspected) exposure to covid-19: Secondary | ICD-10-CM | POA: Insufficient documentation

## 2020-03-22 DIAGNOSIS — Z01812 Encounter for preprocedural laboratory examination: Secondary | ICD-10-CM | POA: Insufficient documentation

## 2020-03-22 LAB — SARS CORONAVIRUS 2 (TAT 6-24 HRS): SARS Coronavirus 2: NEGATIVE

## 2020-03-22 NOTE — Progress Notes (Signed)

## 2020-03-23 NOTE — Progress Notes (Signed)
Chart reviewed with Dr Royce Macadamia. Will evaluate on DOS to determine if drug screen necessary.

## 2020-03-24 ENCOUNTER — Ambulatory Visit (HOSPITAL_BASED_OUTPATIENT_CLINIC_OR_DEPARTMENT_OTHER): Payer: Medicare Other | Admitting: Certified Registered"

## 2020-03-24 ENCOUNTER — Encounter (HOSPITAL_BASED_OUTPATIENT_CLINIC_OR_DEPARTMENT_OTHER)
Admission: RE | Disposition: A | Discharge status: Home / Self Care | Payer: Self-pay | Source: Home / Self Care | Attending: Orthopedic Surgery

## 2020-03-24 ENCOUNTER — Ambulatory Visit (HOSPITAL_BASED_OUTPATIENT_CLINIC_OR_DEPARTMENT_OTHER)
Admission: RE | Admit: 2020-03-24 | Discharge: 2020-03-24 | Disposition: A | Discharge status: Home / Self Care | Payer: Medicare Other | Attending: Orthopedic Surgery | Admitting: Orthopedic Surgery

## 2020-03-24 ENCOUNTER — Encounter (HOSPITAL_BASED_OUTPATIENT_CLINIC_OR_DEPARTMENT_OTHER): Payer: Self-pay | Admitting: Orthopedic Surgery

## 2020-03-24 DIAGNOSIS — E785 Hyperlipidemia, unspecified: Secondary | ICD-10-CM | POA: Diagnosis not present

## 2020-03-24 DIAGNOSIS — F17210 Nicotine dependence, cigarettes, uncomplicated: Secondary | ICD-10-CM | POA: Insufficient documentation

## 2020-03-24 DIAGNOSIS — Y798 Miscellaneous orthopedic devices associated with adverse incidents, not elsewhere classified: Secondary | ICD-10-CM | POA: Insufficient documentation

## 2020-03-24 DIAGNOSIS — Z79899 Other long term (current) drug therapy: Secondary | ICD-10-CM | POA: Diagnosis not present

## 2020-03-24 DIAGNOSIS — T8484XA Pain due to internal orthopedic prosthetic devices, implants and grafts, initial encounter: Secondary | ICD-10-CM | POA: Diagnosis not present

## 2020-03-24 DIAGNOSIS — Z969 Presence of functional implant, unspecified: Secondary | ICD-10-CM

## 2020-03-24 HISTORY — PX: HARDWARE REMOVAL: SHX979

## 2020-03-24 SURGERY — REMOVAL, HARDWARE
Anesthesia: General | Site: Foot | Laterality: Right

## 2020-03-24 MED ORDER — OXYCODONE HCL 5 MG PO TABS: 5.0000 mg | ORAL_TABLET | Freq: Once | ORAL | Status: DC | PRN

## 2020-03-24 MED ORDER — SODIUM CHLORIDE 0.9 % IV SOLN: INTRAVENOUS | Status: DC

## 2020-03-24 MED ORDER — CEFAZOLIN SODIUM-DEXTROSE 2-4 GM/100ML-% IV SOLN
INTRAVENOUS | Status: AC
  Filled 2020-03-24: qty 100

## 2020-03-24 MED ORDER — PROMETHAZINE HCL 25 MG/ML IJ SOLN: 6.2500 mg | INTRAMUSCULAR | Status: DC | PRN

## 2020-03-24 MED ORDER — LIDOCAINE-EPINEPHRINE 1 %-1:100000 IJ SOLN
INTRAMUSCULAR | Status: DC | PRN
  Administered 2020-03-24: 5 mL

## 2020-03-24 MED ORDER — OXYCODONE HCL 5 MG/5ML PO SOLN: 5.0000 mg | Freq: Once | ORAL | Status: DC | PRN

## 2020-03-24 MED ORDER — MEPERIDINE HCL 25 MG/ML IJ SOLN: 6.2500 mg | INTRAMUSCULAR | Status: DC | PRN

## 2020-03-24 MED ORDER — LIDOCAINE-EPINEPHRINE 1 %-1:100000 IJ SOLN
INTRAMUSCULAR | Status: AC
  Filled 2020-03-24: qty 1

## 2020-03-24 MED ORDER — CEFAZOLIN SODIUM-DEXTROSE 2-4 GM/100ML-% IV SOLN
2.0000 g | INTRAVENOUS | Status: AC
  Administered 2020-03-24: 2 g via INTRAVENOUS

## 2020-03-24 MED ORDER — FENTANYL CITRATE (PF) 100 MCG/2ML IJ SOLN
INTRAMUSCULAR | Status: DC | PRN
  Administered 2020-03-24: 50 ug via INTRAVENOUS

## 2020-03-24 MED ORDER — ONDANSETRON HCL 4 MG/2ML IJ SOLN
INTRAMUSCULAR | Status: DC | PRN
  Administered 2020-03-24: 4 mg via INTRAVENOUS

## 2020-03-24 MED ORDER — VANCOMYCIN HCL 500 MG IV SOLR
INTRAVENOUS | Status: DC | PRN
  Administered 2020-03-24: 500 mg via TOPICAL

## 2020-03-24 MED ORDER — VANCOMYCIN HCL 500 MG IV SOLR
INTRAVENOUS | Status: AC
  Filled 2020-03-24: qty 500

## 2020-03-24 MED ORDER — BUPIVACAINE HCL (PF) 0.5 % IJ SOLN
INTRAMUSCULAR | Status: AC
  Filled 2020-03-24: qty 30

## 2020-03-24 MED ORDER — LIDOCAINE HCL (CARDIAC) PF 100 MG/5ML IV SOSY
PREFILLED_SYRINGE | INTRAVENOUS | Status: DC | PRN
  Administered 2020-03-24: 60 mg via INTRAVENOUS

## 2020-03-24 MED ORDER — PROPOFOL 500 MG/50ML IV EMUL
INTRAVENOUS | Status: DC | PRN
  Administered 2020-03-24: 50 ug/kg/min via INTRAVENOUS

## 2020-03-24 MED ORDER — HYDROMORPHONE HCL 1 MG/ML IJ SOLN: 0.2500 mg | INTRAMUSCULAR | Status: DC | PRN

## 2020-03-24 MED ORDER — FENTANYL CITRATE (PF) 100 MCG/2ML IJ SOLN
INTRAMUSCULAR | Status: AC
  Filled 2020-03-24: qty 2

## 2020-03-24 MED ORDER — AMISULPRIDE (ANTIEMETIC) 5 MG/2ML IV SOLN: 10.0000 mg | Freq: Once | INTRAVENOUS | Status: DC | PRN

## 2020-03-24 MED ORDER — 0.9 % SODIUM CHLORIDE (POUR BTL) OPTIME
TOPICAL | Status: DC | PRN
  Administered 2020-03-24: 120 mL

## 2020-03-24 MED ORDER — LACTATED RINGERS IV SOLN: INTRAVENOUS | Status: DC

## 2020-03-24 SURGICAL SUPPLY — 73 items
APL PRP STRL LF DISP 70% ISPRP (MISCELLANEOUS) ×1
APL SKNCLS STERI-STRIP NONHPOA (GAUZE/BANDAGES/DRESSINGS)
BANDAGE ESMARK 6X9 LF (GAUZE/BANDAGES/DRESSINGS) IMPLANT
BENZOIN TINCTURE PRP APPL 2/3 (GAUZE/BANDAGES/DRESSINGS) IMPLANT
BLADE SURG 15 STRL LF DISP TIS (BLADE) ×2 IMPLANT
BLADE SURG 15 STRL SS (BLADE) ×6
BNDG CMPR 9X4 STRL LF SNTH (GAUZE/BANDAGES/DRESSINGS) ×1
BNDG CMPR 9X6 STRL LF SNTH (GAUZE/BANDAGES/DRESSINGS)
BNDG COHESIVE 4X5 TAN STRL (GAUZE/BANDAGES/DRESSINGS) IMPLANT
BNDG COHESIVE 6X5 TAN STRL LF (GAUZE/BANDAGES/DRESSINGS) IMPLANT
BNDG ELASTIC 4X5.8 VLCR STR LF (GAUZE/BANDAGES/DRESSINGS) IMPLANT
BNDG ESMARK 4X9 LF (GAUZE/BANDAGES/DRESSINGS) ×3 IMPLANT
BNDG ESMARK 6X9 LF (GAUZE/BANDAGES/DRESSINGS)
CHLORAPREP W/TINT 26 (MISCELLANEOUS) ×3 IMPLANT
CLOSURE WOUND 1/2 X4 (GAUZE/BANDAGES/DRESSINGS)
COVER BACK TABLE 60X90IN (DRAPES) ×3 IMPLANT
COVER WAND RF STERILE (DRAPES) IMPLANT
CUFF TOURN SGL QUICK 24 (TOURNIQUET CUFF)
CUFF TOURN SGL QUICK 34 (TOURNIQUET CUFF)
CUFF TRNQT CYL 24X4X16.5-23 (TOURNIQUET CUFF) IMPLANT
CUFF TRNQT CYL 34X4.125X (TOURNIQUET CUFF) IMPLANT
DECANTER SPIKE VIAL GLASS SM (MISCELLANEOUS) IMPLANT
DRAPE EXTREMITY T 121X128X90 (DISPOSABLE) ×3 IMPLANT
DRAPE OEC MINIVIEW 54X84 (DRAPES) ×3 IMPLANT
DRAPE SURG 17X23 STRL (DRAPES) IMPLANT
DRAPE U-SHAPE 47X51 STRL (DRAPES) ×3 IMPLANT
DRSG MEPITEL 4X7.2 (GAUZE/BANDAGES/DRESSINGS) ×3 IMPLANT
DRSG PAD ABDOMINAL 8X10 ST (GAUZE/BANDAGES/DRESSINGS) IMPLANT
ELECT REM PT RETURN 9FT ADLT (ELECTROSURGICAL) ×3
ELECTRODE REM PT RTRN 9FT ADLT (ELECTROSURGICAL) ×1 IMPLANT
GAUZE SPONGE 4X4 12PLY STRL (GAUZE/BANDAGES/DRESSINGS) ×3 IMPLANT
GLOVE BIO SURGEON STRL SZ8 (GLOVE) ×3 IMPLANT
GLOVE BIOGEL PI IND STRL 7.0 (GLOVE) ×2 IMPLANT
GLOVE BIOGEL PI IND STRL 8 (GLOVE) ×2 IMPLANT
GLOVE BIOGEL PI INDICATOR 7.0 (GLOVE) ×4
GLOVE BIOGEL PI INDICATOR 8 (GLOVE) ×4
GLOVE ECLIPSE 8.0 STRL XLNG CF (GLOVE) ×3 IMPLANT
GLOVE SURG SS PI 6.0 STRL IVOR (GLOVE) ×3 IMPLANT
GLOVE SURG SS PI 6.5 STRL IVOR (GLOVE) ×3 IMPLANT
GLOVE SURG SS PI 7.0 STRL IVOR (GLOVE) ×3 IMPLANT
GOWN STRL REUS W/ TWL LRG LVL3 (GOWN DISPOSABLE) ×2 IMPLANT
GOWN STRL REUS W/ TWL XL LVL3 (GOWN DISPOSABLE) ×2 IMPLANT
GOWN STRL REUS W/TWL LRG LVL3 (GOWN DISPOSABLE) ×6
GOWN STRL REUS W/TWL XL LVL3 (GOWN DISPOSABLE) ×6
NEEDLE HYPO 22GX1.5 SAFETY (NEEDLE) ×3 IMPLANT
PACK BASIN DAY SURGERY FS (CUSTOM PROCEDURE TRAY) ×3 IMPLANT
PAD CAST 4YDX4 CTTN HI CHSV (CAST SUPPLIES) ×1 IMPLANT
PADDING CAST COTTON 4X4 STRL (CAST SUPPLIES) ×3
PADDING CAST COTTON 6X4 STRL (CAST SUPPLIES) IMPLANT
PENCIL SMOKE EVACUATOR (MISCELLANEOUS) ×3 IMPLANT
SANITIZER HAND PURELL 535ML FO (MISCELLANEOUS) ×3 IMPLANT
SHEET MEDIUM DRAPE 40X70 STRL (DRAPES) ×3 IMPLANT
SLEEVE SCD COMPRESS KNEE MED (MISCELLANEOUS) ×3 IMPLANT
SPLINT FAST PLASTER 5X30 (CAST SUPPLIES)
SPLINT PLASTER CAST FAST 5X30 (CAST SUPPLIES) IMPLANT
SPONGE LAP 18X18 RF (DISPOSABLE) ×3 IMPLANT
STOCKINETTE 6  STRL (DRAPES) ×3
STOCKINETTE 6 STRL (DRAPES) ×1 IMPLANT
STRIP CLOSURE SKIN 1/2X4 (GAUZE/BANDAGES/DRESSINGS) IMPLANT
SUCTION FRAZIER HANDLE 10FR (MISCELLANEOUS) ×3
SUCTION TUBE FRAZIER 10FR DISP (MISCELLANEOUS) ×1 IMPLANT
SUT ETHILON 3 0 PS 1 (SUTURE) ×3 IMPLANT
SUT MNCRL AB 3-0 PS2 18 (SUTURE) IMPLANT
SUT VIC AB 0 SH 27 (SUTURE) IMPLANT
SUT VIC AB 2-0 SH 18 (SUTURE) IMPLANT
SUT VIC AB 2-0 SH 27 (SUTURE)
SUT VIC AB 2-0 SH 27XBRD (SUTURE) IMPLANT
SYR BULB EAR ULCER 3OZ GRN STR (SYRINGE) ×3 IMPLANT
SYR CONTROL 10ML LL (SYRINGE) ×3 IMPLANT
TOWEL GREEN STERILE FF (TOWEL DISPOSABLE) ×3 IMPLANT
TUBE CONNECTING 20'X1/4 (TUBING) ×1
TUBE CONNECTING 20X1/4 (TUBING) ×2 IMPLANT
UNDERPAD 30X36 HEAVY ABSORB (UNDERPADS AND DIAPERS) ×3 IMPLANT

## 2020-03-24 NOTE — Anesthesia Postprocedure Evaluation (Signed)
Anesthesia Post Note  Patient: Maria Friedman  Procedure(s) Performed: Right foot painful hardware removal distal screw and washer (Right Foot)     Patient location during evaluation: PACU Anesthesia Type: MAC Level of consciousness: awake and alert Pain management: pain level controlled Vital Signs Assessment: post-procedure vital signs reviewed and stable Respiratory status: spontaneous breathing, nonlabored ventilation and respiratory function stable Cardiovascular status: blood pressure returned to baseline and stable Postop Assessment: no apparent nausea or vomiting Anesthetic complications: no   No complications documented.  Last Vitals:  Vitals:   03/24/20 0815 03/24/20 0836  BP: 123/71 (!) 148/84  Pulse: 64 67  Resp: 14 16  Temp:  36.5 C  SpO2: 100% 99%    Last Pain:  Vitals:   03/24/20 0836  TempSrc: Oral  PainSc: 0-No pain                 Lynda Rainwater

## 2020-03-24 NOTE — Op Note (Signed)
03/24/2020  8:19 AM  PATIENT:  Maria Friedman  69 y.o. female  PRE-OPERATIVE DIAGNOSIS:  right foot painful hardware  POST-OPERATIVE DIAGNOSIS:  right foot painful hardware  Procedure(s): Removal of deep implant from right medial cuneiform  SURGEON:  [name], MD  ASSISTANT: Mechele Claude, PA-C  ANESTHESIA:   Local, MAC  EBL:  minimal   TOURNIQUET:   Total Tourniquet Time Documented: Calf (Right) - 9 minutes Total: Calf (Right) - 9 minutes  COMPLICATIONS:  None apparent  DISPOSITION:  Extubated, awake and stable to recovery.  INDICATION FOR PROCEDURE: The patient is a 69 year old female with a past medical history significant for smoking and cocaine use.  She has had a right medial intercuneiform arthrodesis many years ago.  One of the screws has partly backed out, and it is prominent at her medial midfoot.  She has a painful callus in this area.  She has failed nonoperative treatment to date including shoewear modification.  She presents today for removal of this painful deep implant.  The risks and benefits of the alternative treatment options have been discussed in detail.  The patient wishes to proceed with surgery and specifically understands risks of bleeding, infection, nerve damage, blood clots, need for additional surgery, amputation and death.  PROCEDURE IN DETAIL:  After pre operative consent was obtained, and the correct operative site was identified, the patient was brought to the operating room and placed supine on the OR table.  Anesthesia was administered.  Pre-operative antibiotics were administered.  A surgical timeout was taken.  The right lower extremity was prepped and draped in standard sterile fashion.  Quarter percent Marcaine with epinephrine and 1% lidocaine plain were mixed together.  Approximately 5 cc of local anesthetic was infiltrated into the subcutaneous tissues around the painful callus.  The foot was then exsanguinated and a 4 inch Esmarch tourniquet  wrapped around the ankle.  An ellipsoid incision was then made around the prominent callus.  Dissection was carried down through the subcutaneous tissues.  The callus was excised in its entirety.  Bursa was excised with a rondure exposing the screw head.  The screw was removed in its entirety without difficulty.  The washer was then identified.  It was removed with a hemostat.  Additional bursa was excised.  The wound was irrigated copiously and sprinkled with vancomycin powder.  The skin edges were freshened with a scalpel.  The skin incision was then closed with horizontal mattress sutures of 3-0 nylon.  Sterile dressings were applied followed by compression wrap.  The tourniquet was released after application of the dressings.  The patient was awakened from anesthesia and transported to the recovery room in stable condition.   FOLLOW UP PLAN: Weightbearing as tolerated in a flat postop shoe.  Follow-up in the office in 2 weeks for suture removal.    Mechele Claude PA-C was present and scrubbed for the duration of the operative case. His assistance was essential in positioning the patient, prepping and draping, gaining and maintaining exposure, performing the operation, closing and dressing the wounds and applying the splint.

## 2020-03-24 NOTE — Anesthesia Preprocedure Evaluation (Signed)
Anesthesia Evaluation  Patient identified by MRN, date of birth, ID band Patient awake    Reviewed: Allergy & Precautions, H&P , NPO status , Patient's Chart, lab work & pertinent test results  Airway Mallampati: II  TM Distance: >3 FB Neck ROM: Full    Dental no notable dental hx. (+) Edentulous Upper, Edentulous Lower, Dental Advisory Given   Pulmonary neg pulmonary ROS, Current Smoker and Patient abstained from smoking.,    Pulmonary exam normal breath sounds clear to auscultation       Cardiovascular negative cardio ROS   Rhythm:Regular Rate:Normal     Neuro/Psych  Headaches, PSYCHIATRIC DISORDERS Anxiety Depression    GI/Hepatic negative GI ROS, Neg liver ROS,   Endo/Other  negative endocrine ROS  Renal/GU negative Renal ROS  negative genitourinary   Musculoskeletal   Abdominal   Peds  Hematology negative hematology ROS (+)   Anesthesia Other Findings   Reproductive/Obstetrics negative OB ROS                             Anesthesia Physical  Anesthesia Plan  ASA: II  Anesthesia Plan: General   Post-op Pain Management:    Induction: Intravenous  PONV Risk Score and Plan: 2 and Ondansetron, Midazolam and Treatment may vary due to age or medical condition  Airway Management Planned: LMA  Additional Equipment:   Intra-op Plan:   Post-operative Plan: Extubation in OR  Informed Consent: I have reviewed the patients History and Physical, chart, labs and discussed the procedure including the risks, benefits and alternatives for the proposed anesthesia with the patient or authorized representative who has indicated his/her understanding and acceptance.     Dental advisory given  Plan Discussed with: CRNA  Anesthesia Plan Comments:         Anesthesia Quick Evaluation

## 2020-03-24 NOTE — H&P (Signed)
Maria Friedman is an 69 y.o. female.   Chief Complaint:  Right foot pain HPI:  The patient is a 69 y/o female with PMH of cocaine use.  She has chronic right foot pain due to a prominent screw head at her right medial midfoot.  She presents now for removal of this painful screw.  Past Medical History:  Diagnosis Date  . Allergy   . Anxiety   . Depression   . Headache(784.0)   . Hyperlipidemia   . Seasonal allergies     Past Surgical History:  Procedure Laterality Date  . FOOT FRACTURE SURGERY  1999   2 screws in foot , R  . RADIOLOGY WITH ANESTHESIA N/A 01/08/2013   Procedure: RADIOLOGY WITH ANESTHESIA: CEREBRAL ANGIOGRAM;  Surgeon: Rob Hickman, MD;  Location: [facility];  Service: Radiology;  Laterality: N/A;  . TOOTH EXTRACTION     total extractions, set up for dentures   . VAGINAL DELIVERY     x3     Family History  Problem Relation Age of Onset  . COPD Mother   . Diabetes Other   . Thyroid disease Other   . Colon polyps Sister   . Colon cancer Maternal Aunt   . Esophageal cancer Neg Hx   . Rectal cancer Neg Hx   . Stomach cancer Neg Hx    Social History:  reports that she has been smoking cigarettes. She has been smoking about 0.50 packs per day. She has never used smokeless tobacco. She reports current alcohol use of about 7.0 standard drinks of alcohol per week. She reports current drug use. Frequency: 2.00 times per week. Drugs: Marijuana and Cocaine.  Allergies: No Known Allergies  Facility-Administered Medications Prior to Admission  Medication Dose Route Frequency Provider Last Rate Last Admin  . 0.9 %  sodium chloride infusion  500 mL Intravenous Continuous Armbruster, Carlota Raspberry, MD       Medications Prior to Admission  Medication Sig Dispense Refill  . albuterol (VENTOLIN HFA) 108 (90 Base) MCG/ACT inhaler Inhale into the lungs every 6 (six) hours as needed for wheezing or shortness of breath.    . fluticasone (FLONASE) 50 MCG/ACT nasal spray Place 2 sprays  into the nose daily as needed for rhinitis.    Marland Kitchen gabapentin (NEURONTIN) 300 MG capsule Take 300 mg by mouth 3 (three) times daily.  5  . HYDROcodone-acetaminophen (NORCO/VICODIN) 5-325 MG tablet Take 1 tablet by mouth 2 (two) times daily as needed.  0  . simvastatin (ZOCOR) 10 MG tablet Take 10 mg by mouth at bedtime.  5  . diclofenac (VOLTAREN) 75 MG EC tablet Take 75 mg by mouth 2 (two) times daily.      Results for orders placed or performed during the hospital encounter of 03/22/20 (from the past 48 hour(s))  SARS CORONAVIRUS 2 (TAT 6-24 HRS) Nasopharyngeal Nasopharyngeal Swab     Status: None   Collection Time: 03/22/20 11:32 AM   Specimen: Nasopharyngeal Swab  Result Value Ref Range   SARS Coronavirus 2 NEGATIVE NEGATIVE    Comment: (NOTE) SARS-CoV-2 target nucleic acids are NOT DETECTED.  The SARS-CoV-2 RNA is generally detectable in upper and lower respiratory specimens during the acute phase of infection. Negative results do not preclude SARS-CoV-2 infection, do not rule out co-infections with other pathogens, and should not be used as the sole basis for treatment or other patient management decisions. Negative results must be combined with clinical observations, patient history, and epidemiological information. The expected result  is Negative.  Fact Sheet for Patients: SugarRoll.be  Fact Sheet for Healthcare Providers: https://www.woods-mathews.com/  This test is not yet approved or cleared by the [place] FDA and  has been authorized for detection and/or diagnosis of SARS-CoV-2 by FDA under an Emergency Use Authorization (EUA). This EUA will remain  in effect (meaning this test can be used) for the duration of the COVID-19 declaration under Se ction 564(b)(1) of the Act, 21 U.S.C. section 360bbb-3(b)(1), unless the authorization is terminated or revoked sooner.  Performed at [facility], [facility] [address],  [place], [place] [address]    No results found.  Review of Systems  No recent f/c/n/v/wt loss  Blood pressure 126/72, pulse 74, temperature 98 F (36.7 C), temperature source Oral, resp. rate 16, height 5\' 4"  (1.626 m), weight 39.1 kg, SpO2 100 %. Physical Exam  wn wd woman in nad.  A and o x 4.  Mood and affec tnormal.  EOMI.  resp unlabored.  R foot with tender prominence at the medial midfoot.  Callous formed over the prominence.  sens to LT at the medial midfoot.  Pulses at the foot are palpable.  No lymphadenopathy.  5/5 strength in PF and DF of the ankle and toes.  Assessment/Plan Right foot painful hardware - to OR for removal of the deep implant.  The risks and benefits of the alternative treatment options have been discussed in detail.  The patient wishes to proceed with surgery and specifically understands risks of bleeding, infection, nerve damage, blood clots, need for additional surgery, amputation and death.   [name], MD 04/11/20, 7:23 AM

## 2020-03-24 NOTE — Anesthesia Procedure Notes (Signed)
Procedure Name: MAC Date/Time: 03/24/2020 7:44 AM Performed by: Signe Colt, CRNA Pre-anesthesia Checklist: Patient identified, Emergency Drugs available, Suction available, Patient being monitored and Timeout performed Patient Re-evaluated:Patient Re-evaluated prior to induction Oxygen Delivery Method: Simple face mask

## 2020-03-24 NOTE — Discharge Instructions (Addendum)
Maria Simmer, MD [facility]  Please read the following information regarding your care after surgery.  Medications  You only need a prescription for the narcotic pain medicine (ex. oxycodone, Percocet, Norco).  All of the other medicines listed below are available over the counter. X Aleve 2 pills twice a day for the first 3 days after surgery. X hydrocodone (that you have at home) as prescribed for severe pain  Narcotic pain medicine (ex. oxycodone, Percocet, Vicodin) will cause constipation.  To prevent this problem, take the following medicines while you are taking any pain medicine. X docusate sodium (Colace) 100 mg twice a day X senna (Senokot) 2 tablets twice a day   Weight Bearing X Bear weight when you are able on your operated leg or foot in the flat post-op shoe.   Cast / Splint / Dressing X Remove your dressing 3 days after surgery and cover the incisions with dry dressings.    After your dressing, cast or splint is removed; you may shower, but do not soak or scrub the wound.  Allow the water to run over it, and then gently pat it dry.  Swelling It is normal for you to have swelling where you had surgery.  To reduce swelling and pain, keep your toes above your nose for at least 3 days after surgery.  It may be necessary to keep your foot or leg elevated for several weeks.  If it hurts, it should be elevated.  Follow Up Call my office at [phone] when you are discharged from the hospital or surgery center to schedule an appointment to be seen two weeks after surgery.  Call my office at [phone] if you develop a fever >101.5 F, nausea, vomiting, bleeding from the surgical site or severe pain.     Post Anesthesia Home Care Instructions  Activity: Get plenty of rest for the remainder of the day. A responsible individual must stay with you for 24 hours following the procedure.  For the next 24 hours, DO NOT: -Drive a car -Paediatric nurse -Drink alcoholic  beverages -Take any medication unless instructed by your physician -Make any legal decisions or sign important papers.  Meals: Start with liquid foods such as gelatin or soup. Progress to regular foods as tolerated. Avoid greasy, spicy, heavy foods. If nausea and/or vomiting occur, drink only clear liquids until the nausea and/or vomiting subsides. Call your physician if vomiting continues.  Special Instructions/Symptoms: Your throat may feel dry or sore from the anesthesia or the breathing tube placed in your throat during surgery. If this causes discomfort, gargle with warm salt water. The discomfort should disappear within 24 hours.

## 2020-03-24 NOTE — Transfer of Care (Signed)
Immediate Anesthesia Transfer of Care Note  Patient: Maria Friedman  Procedure(s) Performed: Right foot painful hardware removal distal screw and washer (Right Foot)  Patient Location: PACU  Anesthesia Type:General  Level of Consciousness: awake, alert , oriented and patient cooperative  Airway & Oxygen Therapy: Patient Spontanous Breathing and Patient connected to face mask oxygen  Post-op Assessment: Report given to RN and Post -op Vital signs reviewed and stable  Post vital signs: Reviewed and stable  Last Vitals:  Vitals Value Taken Time  BP    Temp    Pulse 73 03/24/20 0802  Resp 12 03/24/20 0802  SpO2 100 % 03/24/20 0802  Vitals shown include unvalidated device data.  Last Pain:  Vitals:   03/24/20 0634  TempSrc: Oral  PainSc: 0-No pain         Complications: No complications documented.

## 2020-03-25 ENCOUNTER — Encounter (HOSPITAL_BASED_OUTPATIENT_CLINIC_OR_DEPARTMENT_OTHER): Payer: Self-pay | Admitting: Orthopedic Surgery

## 2020-09-10 ENCOUNTER — Emergency Department (HOSPITAL_COMMUNITY): Payer: Medicare Other

## 2020-09-10 ENCOUNTER — Emergency Department (HOSPITAL_COMMUNITY)
Admission: EM | Admit: 2020-09-10 | Discharge: 2020-09-11 | Disposition: A | Discharge status: Home / Self Care | Payer: Medicare Other | Attending: Emergency Medicine | Admitting: Emergency Medicine

## 2020-09-10 DIAGNOSIS — R4182 Altered mental status, unspecified: Secondary | ICD-10-CM | POA: Diagnosis not present

## 2020-09-10 DIAGNOSIS — S59911A Unspecified injury of right forearm, initial encounter: Secondary | ICD-10-CM | POA: Diagnosis present

## 2020-09-10 DIAGNOSIS — R262 Difficulty in walking, not elsewhere classified: Secondary | ICD-10-CM | POA: Insufficient documentation

## 2020-09-10 DIAGNOSIS — S80212A Abrasion, left knee, initial encounter: Secondary | ICD-10-CM | POA: Diagnosis not present

## 2020-09-10 DIAGNOSIS — Z20822 Contact with and (suspected) exposure to covid-19: Secondary | ICD-10-CM | POA: Diagnosis not present

## 2020-09-10 DIAGNOSIS — R7402 Elevation of levels of lactic acid dehydrogenase (LDH): Secondary | ICD-10-CM | POA: Insufficient documentation

## 2020-09-10 DIAGNOSIS — F05 Delirium due to known physiological condition: Secondary | ICD-10-CM

## 2020-09-10 DIAGNOSIS — S50811A Abrasion of right forearm, initial encounter: Secondary | ICD-10-CM | POA: Diagnosis not present

## 2020-09-10 DIAGNOSIS — F17210 Nicotine dependence, cigarettes, uncomplicated: Secondary | ICD-10-CM | POA: Diagnosis not present

## 2020-09-10 DIAGNOSIS — S50812A Abrasion of left forearm, initial encounter: Secondary | ICD-10-CM | POA: Diagnosis not present

## 2020-09-10 DIAGNOSIS — E162 Hypoglycemia, unspecified: Secondary | ICD-10-CM | POA: Diagnosis not present

## 2020-09-10 DIAGNOSIS — S80211A Abrasion, right knee, initial encounter: Secondary | ICD-10-CM | POA: Insufficient documentation

## 2020-09-10 DIAGNOSIS — W1839XA Other fall on same level, initial encounter: Secondary | ICD-10-CM | POA: Diagnosis not present

## 2020-09-10 DIAGNOSIS — R509 Fever, unspecified: Secondary | ICD-10-CM | POA: Clinically undetermined

## 2020-09-10 DIAGNOSIS — S0081XA Abrasion of other part of head, initial encounter: Secondary | ICD-10-CM | POA: Insufficient documentation

## 2020-09-10 DIAGNOSIS — Y9301 Activity, walking, marching and hiking: Secondary | ICD-10-CM | POA: Diagnosis not present

## 2020-09-10 DIAGNOSIS — Y92007 Garden or yard of unspecified non-institutional (private) residence as the place of occurrence of the external cause: Secondary | ICD-10-CM | POA: Insufficient documentation

## 2020-09-10 DIAGNOSIS — T68XXXA Hypothermia, initial encounter: Secondary | ICD-10-CM | POA: Diagnosis not present

## 2020-09-10 DIAGNOSIS — R531 Weakness: Secondary | ICD-10-CM

## 2020-09-10 DIAGNOSIS — F1020 Alcohol dependence, uncomplicated: Secondary | ICD-10-CM | POA: Diagnosis present

## 2020-09-10 LAB — CBG MONITORING, ED
Glucose-Capillary: 67 mg/dL — ABNORMAL LOW (ref 70–99)
Glucose-Capillary: 72 mg/dL (ref 70–99)

## 2020-09-10 LAB — CBC WITH DIFFERENTIAL/PLATELET
Abs Immature Granulocytes: 0.03 10*3/uL (ref 0.00–0.07)
Basophils Absolute: 0.1 10*3/uL (ref 0.0–0.1)
Basophils Relative: 1 %
Eosinophils Absolute: 0.1 10*3/uL (ref 0.0–0.5)
Eosinophils Relative: 1 %
HCT: 40.8 % (ref 36.0–46.0)
Hemoglobin: 13.3 g/dL (ref 12.0–15.0)
Immature Granulocytes: 0 %
Lymphocytes Relative: 18 %
Lymphs Abs: 1.9 10*3/uL (ref 0.7–4.0)
MCH: 30.4 pg (ref 26.0–34.0)
MCHC: 32.6 g/dL (ref 30.0–36.0)
MCV: 93.4 fL (ref 80.0–100.0)
Monocytes Absolute: 0.4 10*3/uL (ref 0.1–1.0)
Monocytes Relative: 4 %
Neutro Abs: 7.7 10*3/uL (ref 1.7–7.7)
Neutrophils Relative %: 76 %
Platelets: 302 10*3/uL (ref 150–400)
RBC: 4.37 MIL/uL (ref 3.87–5.11)
RDW: 14.6 % (ref 11.5–15.5)
WBC: 10.1 10*3/uL (ref 4.0–10.5)
nRBC: 0 % (ref 0.0–0.2)

## 2020-09-10 LAB — COMPREHENSIVE METABOLIC PANEL
ALT: 21 U/L (ref 0–44)
AST: 36 U/L (ref 15–41)
Albumin: 4.5 g/dL (ref 3.5–5.0)
Alkaline Phosphatase: 92 U/L (ref 38–126)
Anion gap: 17 — ABNORMAL HIGH (ref 5–15)
BUN: 6 mg/dL — ABNORMAL LOW (ref 8–23)
CO2: 17 mmol/L — ABNORMAL LOW (ref 22–32)
Calcium: 9.2 mg/dL (ref 8.9–10.3)
Chloride: 107 mmol/L (ref 98–111)
Creatinine, Ser: 0.54 mg/dL (ref 0.44–1.00)
GFR, Estimated: >60 mL/min (ref >60)
Glucose, Bld: 72 mg/dL (ref 70–99)
Potassium: 3.7 mmol/L (ref 3.5–5.1)
Sodium: 141 mmol/L (ref 135–145)
Total Bilirubin: 0.5 mg/dL (ref 0.3–1.2)
Total Protein: 7.9 g/dL (ref 6.5–8.1)

## 2020-09-10 LAB — URINALYSIS, ROUTINE W REFLEX MICROSCOPIC
Bilirubin Urine: NEGATIVE
Glucose, UA: 150 mg/dL — AB
Hgb urine dipstick: NEGATIVE
Ketones, ur: NEGATIVE mg/dL
Leukocytes,Ua: NEGATIVE
Nitrite: NEGATIVE
Protein, ur: NEGATIVE mg/dL
Specific Gravity, Urine: 1.012 (ref 1.005–1.030)
pH: 5 (ref 5.0–8.0)

## 2020-09-10 LAB — RAPID URINE DRUG SCREEN, HOSP PERFORMED
Amphetamines: NOT DETECTED
Barbiturates: NOT DETECTED
Benzodiazepines: POSITIVE — AB
Cocaine: POSITIVE — AB
Opiates: NOT DETECTED
Tetrahydrocannabinol: POSITIVE — AB

## 2020-09-10 LAB — BRAIN NATRIURETIC PEPTIDE: B Natriuretic Peptide: 22.1 pg/mL (ref 0.0–100.0)

## 2020-09-10 LAB — ETHANOL: Alcohol, Ethyl (B): 235 mg/dL — ABNORMAL HIGH (ref <10)

## 2020-09-10 LAB — LACTIC ACID, PLASMA
Lactic Acid, Venous: 4.3 mmol/L (ref 0.5–1.9)
Lactic Acid, Venous: 5.3 mmol/L (ref 0.5–1.9)

## 2020-09-10 LAB — LIPASE, BLOOD: Lipase: 23 U/L (ref 11–51)

## 2020-09-10 MED ORDER — SODIUM CHLORIDE 0.9 % IV BOLUS
1000.0000 mL | Freq: Once | INTRAVENOUS | Status: AC
  Administered 2020-09-10: 21:00:00 1000 mL via INTRAVENOUS

## 2020-09-10 MED ORDER — DEXTROSE 50 % IV SOLN
1.0000 | Freq: Once | INTRAVENOUS | Status: AC
  Administered 2020-09-10: 50 mL via INTRAVENOUS
  Filled 2020-09-10: qty 50

## 2020-09-10 MED ORDER — SODIUM CHLORIDE 0.9 % IV SOLN: INTRAVENOUS | Status: DC

## 2020-09-10 MED ORDER — SODIUM CHLORIDE 0.9 % IV BOLUS
500.0000 mL | Freq: Once | INTRAVENOUS | Status: AC
  Administered 2020-09-10: 500 mL via INTRAVENOUS

## 2020-09-10 MED ORDER — LACTATED RINGERS IV BOLUS
1000.0000 mL | Freq: Once | INTRAVENOUS | Status: AC
  Administered 2020-09-11: 01:00:00 1000 mL via INTRAVENOUS

## 2020-09-10 MED ORDER — NALOXONE HCL 0.4 MG/ML IJ SOLN
0.4000 mg | Freq: Once | INTRAMUSCULAR | Status: AC
  Administered 2020-09-10: 0.4 mg via INTRAVENOUS
  Filled 2020-09-10: qty 1

## 2020-09-10 NOTE — ED Notes (Signed)
Pt is not able to ambulate in the hall at this time. Waiting for pt to be awake and alert

## 2020-09-10 NOTE — ED Notes (Signed)
Attempted to ambulate pt in hallway. Pt unable to stand at side of bed even w/ assistance. Pt returned safely into bed. Will continue to monitor. MD notified
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
## 2020-09-10 NOTE — ED Notes (Signed)
MD notified of second lactic level increase.

## 2020-09-10 NOTE — ED Notes (Signed)
Gave daughter in law, Lauro Regulus, update over the phone. Said that she thinks a PT consult would be beneficial as pt lives on her own. Lauro Regulus talked to pt on the phone.
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
## 2020-09-10 NOTE — ED Notes (Signed)
When doing in & out cath, pt hit NT

## 2020-09-11 DIAGNOSIS — R262 Difficulty in walking, not elsewhere classified: Secondary | ICD-10-CM | POA: Clinically undetermined

## 2020-09-11 DIAGNOSIS — F1020 Alcohol dependence, uncomplicated: Secondary | ICD-10-CM

## 2020-09-11 DIAGNOSIS — R509 Fever, unspecified: Secondary | ICD-10-CM

## 2020-09-11 LAB — BASIC METABOLIC PANEL
Anion gap: 17 — ABNORMAL HIGH (ref 5–15)
BUN: <5 mg/dL — ABNORMAL LOW (ref 8–23)
CO2: 15 mmol/L — ABNORMAL LOW (ref 22–32)
Calcium: 9.1 mg/dL (ref 8.9–10.3)
Chloride: 105 mmol/L (ref 98–111)
Creatinine, Ser: 0.53 mg/dL (ref 0.44–1.00)
GFR, Estimated: >60 mL/min (ref >60)
Glucose, Bld: 85 mg/dL (ref 70–99)
Potassium: 4.8 mmol/L (ref 3.5–5.1)
Sodium: 137 mmol/L (ref 135–145)

## 2020-09-11 LAB — CBC WITH DIFFERENTIAL/PLATELET
Abs Immature Granulocytes: 0.04 10*3/uL (ref 0.00–0.07)
Basophils Absolute: 0 10*3/uL (ref 0.0–0.1)
Basophils Relative: 0 %
Eosinophils Absolute: 0 10*3/uL (ref 0.0–0.5)
Eosinophils Relative: 0 %
HCT: 34 % — ABNORMAL LOW (ref 36.0–46.0)
Hemoglobin: 11 g/dL — ABNORMAL LOW (ref 12.0–15.0)
Immature Granulocytes: 0 %
Lymphocytes Relative: 12 %
Lymphs Abs: 1.4 10*3/uL (ref 0.7–4.0)
MCH: 29.4 pg (ref 26.0–34.0)
MCHC: 32.4 g/dL (ref 30.0–36.0)
MCV: 90.9 fL (ref 80.0–100.0)
Monocytes Absolute: 0.9 10*3/uL (ref 0.1–1.0)
Monocytes Relative: 7 %
Neutro Abs: 9.8 10*3/uL — ABNORMAL HIGH (ref 1.7–7.7)
Neutrophils Relative %: 81 %
Platelets: 301 10*3/uL (ref 150–400)
RBC: 3.74 MIL/uL — ABNORMAL LOW (ref 3.87–5.11)
RDW: 14.6 % (ref 11.5–15.5)
WBC: 12.2 10*3/uL — ABNORMAL HIGH (ref 4.0–10.5)
nRBC: 0 % (ref 0.0–0.2)

## 2020-09-11 LAB — LACTIC ACID, PLASMA
Lactic Acid, Venous: 3.7 mmol/L (ref 0.5–1.9)
Lactic Acid, Venous: 1.9 mmol/L (ref 0.5–1.9)
Lactic Acid, Venous: 1.3 mmol/L (ref 0.5–1.9)

## 2020-09-11 LAB — I-STAT VENOUS BLOOD GAS, ED
Acid-Base Excess: 0 mmol/L (ref 0.0–2.0)
Bicarbonate: 20.3 mmol/L (ref 20.0–28.0)
Calcium, Ion: 0.95 mmol/L — ABNORMAL LOW (ref 1.15–1.40)
HCT: 35 % — ABNORMAL LOW (ref 36.0–46.0)
Hemoglobin: 11.9 g/dL — ABNORMAL LOW (ref 12.0–15.0)
O2 Saturation: 99 %
Potassium: 3.7 mmol/L (ref 3.5–5.1)
Sodium: 134 mmol/L — ABNORMAL LOW (ref 135–145)
TCO2: 21 mmol/L — ABNORMAL LOW (ref 22–32)
pCO2, Ven: 22.4 mmHg — ABNORMAL LOW (ref 44.0–60.0)
pH, Ven: 7.566 — ABNORMAL HIGH (ref 7.250–7.430)
pO2, Ven: 105 mmHg — ABNORMAL HIGH (ref 32.0–45.0)

## 2020-09-11 LAB — COMPREHENSIVE METABOLIC PANEL
ALT: 20 U/L (ref 0–44)
AST: 40 U/L (ref 15–41)
Albumin: 3.4 g/dL — ABNORMAL LOW (ref 3.5–5.0)
Alkaline Phosphatase: 77 U/L (ref 38–126)
Anion gap: 12 (ref 5–15)
BUN: <5 mg/dL — ABNORMAL LOW (ref 8–23)
CO2: 18 mmol/L — ABNORMAL LOW (ref 22–32)
Calcium: 8.6 mg/dL — ABNORMAL LOW (ref 8.9–10.3)
Chloride: 105 mmol/L (ref 98–111)
Creatinine, Ser: 0.58 mg/dL (ref 0.44–1.00)
GFR, Estimated: >60 mL/min (ref >60)
Glucose, Bld: 92 mg/dL (ref 70–99)
Potassium: 3.8 mmol/L (ref 3.5–5.1)
Sodium: 135 mmol/L (ref 135–145)
Total Bilirubin: 0.7 mg/dL (ref 0.3–1.2)
Total Protein: 6.3 g/dL — ABNORMAL LOW (ref 6.5–8.1)

## 2020-09-11 LAB — OSMOLALITY: Osmolality: 281 mOsm/kg (ref 275–295)

## 2020-09-11 LAB — RESP PANEL BY RT-PCR (FLU A&B, COVID) ARPGX2
Influenza A by PCR: NEGATIVE
Influenza B by PCR: NEGATIVE
SARS Coronavirus 2 by RT PCR: NEGATIVE

## 2020-09-11 MED ORDER — PIPERACILLIN-TAZOBACTAM 3.375 G IVPB 30 MIN
3.3750 g | Freq: Once | INTRAVENOUS | Status: AC
  Administered 2020-09-11: 11:00:00 3.375 g via INTRAVENOUS
  Filled 2020-09-11: qty 50

## 2020-09-11 MED ORDER — DICLOFENAC SODIUM 75 MG PO TBEC
75.0000 mg | DELAYED_RELEASE_TABLET | Freq: Two times a day (BID) | ORAL | Status: DC
  Filled 2020-09-11 (×2): qty 1

## 2020-09-11 MED ORDER — ACETAMINOPHEN 325 MG PO TABS
650.0000 mg | ORAL_TABLET | Freq: Once | ORAL | Status: AC
  Administered 2020-09-11: 09:00:00 650 mg via ORAL
  Filled 2020-09-11: qty 2

## 2020-09-11 MED ORDER — MIRTAZAPINE 30 MG PO TABS: 30.0000 mg | ORAL_TABLET | Freq: Every day | ORAL | Status: DC

## 2020-09-11 MED ORDER — SODIUM CHLORIDE 0.9 % IV BOLUS
1000.0000 mL | Freq: Once | INTRAVENOUS | Status: AC
  Administered 2020-09-11: 06:00:00 1000 mL via INTRAVENOUS

## 2020-09-11 MED ORDER — SIMVASTATIN 20 MG PO TABS: 10.0000 mg | ORAL_TABLET | Freq: Every day | ORAL | Status: DC

## 2020-09-11 NOTE — ED Notes (Signed)
MD notified of Lactic level

## 2020-09-11 NOTE — TOC Initial Note (Signed)
Transition of Care [facility]) - Initial/Assessment Note    Patient Details  Name: Maria Friedman MRN: [id number] Date of Birth: January 08, 1951  Transition of [facility]) CM/SW Contact:    Verdell Carmine, RN Phone Number: 09/11/2020, 11:57 AM  Clinical Narrative:                 PT consult appreciated, when patient interviewed for choices, patient declined [place] stating she will stay with her daughter for a while and she has decided she is not going out alone any longer.  I encouraged her to seek a appointment with her primary MD to discuss outpatient PT for strengthening. Placed on patient instructions.   Expected Discharge Plan: [place] Barriers to Discharge: No Barriers Identified   Patient Goals and CMS Choice     Choice offered to / list presented to : Patient  Expected Discharge Plan and Services Expected Discharge Plan: [place]                                           Representative spoke with at [place]: Patient refusing [place] at this time states she is going to stay with her daughter for a while  Prior Living Arrangements/Services   Lives with:: Self Patient language and need for interpreter reviewed:: Yes        Need for Family Participation in Patient Care: Yes (Comment) Care giver support system in place?: Yes (comment)   Criminal Activity/Legal Involvement Pertinent to Current Situation/Hospitalization: No - Comment as needed  Activities of Daily Living      Permission Sought/Granted                  Emotional Assessment       Orientation: : Oriented to Self,Oriented to Place,Oriented to  Time,Oriented to Situation Alcohol / Substance Use: Alcohol Use Psych Involvement: No (comment)  Admission diagnosis:  ALTERED BEHAVIORAL Patient Active Problem List   Diagnosis Date Noted  . Alcohol use disorder, severe, dependence ([place]) 05/18/2015  . 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 Severe alcohol  use disorder ([place]) 05/18/2015  . Mood disorder ([place])    PCP:  Nolene Ebbs, MD Pharmacy:   [place], [place] - [address] [place]  [address] Phone: [phone] Fax: [place], [place] [address] Phone: [phone] Fax: [phone]     Social Determinants of Health (SDOH) Interventions    Readmission Risk Interventions No flowsheet data found.

## 2020-09-11 NOTE — Progress Notes (Signed)
CSW acknowledges order for [facility] and DME. CSW notes PT order placed for level of care recommendations. CSW will follow for PT recommendations and follow-up as indicated.

## 2020-09-11 NOTE — ED Provider Notes (Signed)
Patient care assumed at 0700.  Pt here with AMS following fall, found to be intoxicated with lactic acidosis.  Pt initially refused admission with plan to d/c AMA with home health after PT consult.    Pt developed temperature to 100.2 during ED stay. Denies complaints aside from generalized weakness and poor appetite.  Has been fully vaccinated for covid 19.  She is awake alert and oriented, MAE symmetrically.  Repeat labs obtained and abx ordered.    Repeat labs with mild leukocytosis, clearing of lactic acid, improvement in CMP.  On re-assessment pt reports ongoing nausea, generalized weakness and is now agreeable with admission as she feels to weak to d/c home to be alone.  hospitalist consulted for admission for febrile illness, generalized weakness.    Medicine consulted for admission. She was evaluated by the hospitalist. On their assessment she was able to ambulate without difficulty. Her fever had resolved. He felt she was stable for discharge home. Patient is in agreement with discharge home. Plan to discharge home based off of hospitalist recommendations.   Quintella Reichert, MD 09/11/20 [phone]

## 2020-09-11 NOTE — Evaluation (Signed)
Physical Therapy Evaluation Patient Details Name: Maria Friedman MRN: [id number] DOB: 09/07/1951 Today's Date: 09/11/2020   History of Present Illness  69 y.o. female admitted on 09/10/20 forAMS, multiple falls, combative in route to the ED.  Pt dx with alcohol intoxication and mild anion gap.  Pt with significant PMH of R foot fx surgery in 1999, and hardware removal in 2021.  Clinical Impression  Pt was able to walk a good distance around the unit with me starting off as min assist for consistent LOB to the left.  As we continued, her staggering improved and she could be as light as min guard assist.  A cane (which is what she reports using at home) should suffice.  She was reluctantly agreeable to HHPT if this can be approved for her to work on gait and balance training.   PT to follow acutely for deficits listed below.      Follow Up Recommendations Home health PT    Equipment Recommendations  None recommended by PT    Recommendations for Other Services       Precautions / Restrictions Precautions Precautions: Fall Precaution Comments: h/o multiple recent falls      Mobility  Bed Mobility Overal bed mobility: Modified Independent             General bed mobility comments: a little extra time needed, but able to do without external assist.    Transfers Overall transfer level: Needs assistance Equipment used: 1 person hand held assist Transfers: Sit to/from Stand Sit to Stand: Min assist         General transfer comment: Min assist for immediate LOB upon standing.  Ambulation/Gait Ambulation/Gait assistance: Min assist;Min guard Gait Distance (Feet): 300 Feet Assistive device: 1 person hand held assist Gait Pattern/deviations: Step-through pattern;Staggering left     General Gait Details: Pt consistently staggers left during gait requiring min assist initially to correct, the further we went the more stedy she got getting as good as min guard assist.  She would  likely do well with a cane which she reports she has at home if she consistently used it and did not embibe.  Stairs            Wheelchair Mobility    Modified Rankin (Stroke Patients Only)       Balance Overall balance assessment: History of Falls;Needs assistance Sitting-balance support: No upper extremity supported;Feet supported Sitting balance-Leahy Scale: Good   Postural control: Left lateral lean;Posterior lean Standing balance support: Single extremity supported Standing balance-Leahy Scale: Poor Standing balance comment: static standing with posterior left LOB.                             Pertinent Vitals/Pain Pain Assessment: No/denies pain    Home Living Family/patient expects to be discharged to:: Private residence Living Arrangements: Alone Available Help at Discharge: Family;Friend(s);Available PRN/intermittently Type of Home: House Home Access: Level entry     Home Layout: One level Home Equipment: Cane - single point      Prior Function Level of Independence: Independent with assistive device(s)         Comments: pt uses cane "sometimes"     Hand Dominance   Dominant Hand: Right    Extremity/Trunk Assessment   Upper Extremity Assessment Upper Extremity Assessment: Generalized weakness    Lower Extremity Assessment Lower Extremity Assessment: Generalized weakness    Cervical / Trunk Assessment Cervical / Trunk Assessment: Kyphotic  Communication  Communication: No difficulties  Cognition Arousal/Alertness: Awake/alert Behavior During Therapy: WFL for tasks assessed/performed Overall Cognitive Status: No family/caregiver present to determine baseline cognitive functioning                                 General Comments: Pt seems less confused than described when she came to the hospital, calm and cooperative with me.  Not formally assessed.      General Comments General comments (skin integrity,  edema, etc.): HR up to 127 during gait.    Exercises     Assessment/Plan    PT Assessment Patient needs continued PT services  PT Problem List Decreased activity tolerance;Decreased balance;Decreased mobility;Decreased cognition;Decreased knowledge of use of DME       PT Treatment Interventions DME instruction;Gait training;Stair training;Functional mobility training;Therapeutic activities;Therapeutic exercise;Balance training;Neuromuscular re-education;Cognitive remediation;Patient/family education    PT Goals (Current goals can be found in the Care Plan section)  Acute Rehab PT Goals Patient Stated Goal: to go home PT Goal Formulation: With patient Time For Goal Achievement: 09/25/20 Potential to Achieve Goals: Good    Frequency Min 3X/week   Barriers to discharge        Co-evaluation               AM-PAC PT "6 Clicks" Mobility  Outcome Measure Help needed turning from your back to your side while in a flat bed without using bedrails?: None Help needed moving from lying on your back to sitting on the side of a flat bed without using bedrails?: None Help needed moving to and from a bed to a chair (including a wheelchair)?: A Little Help needed standing up from a chair using your arms (e.g., wheelchair or bedside chair)?: A Little Help needed to walk in hospital room?: A Little Help needed climbing 3-5 steps with a railing? : A Little 6 Click Score: 20    End of Session   Activity Tolerance: Patient tolerated treatment well Patient left: in bed;with call bell/phone within reach   PT Visit Diagnosis: History of falling (Z91.81);Difficulty in walking, not elsewhere classified (R26.2)    Time: 1610-9604 PT Time Calculation (min) (ACUTE ONLY): 18 min   Charges:   PT Evaluation $PT Eval Low Complexity: Herbst, PT, DPT  Acute Rehabilitation [phone] pager [phone]) [phone] office

## 2020-09-11 NOTE — ED Provider Notes (Signed)
5:49 AM Assumed care from Drs Tyrone Nine and Rogene Houston, please see their note for full history, physical and decision making until this point. In brief this is a 69 y.o. year old female who presented to the ED tonight with Altered Mental Status and Fall     Patient is here for walking around her yard.  Concern for altered mental status.  Apparently patient has been improving.  She has had labs drawn and is pending repeat of lactic acid.  Has had some fluids.  Is still having difficulty walking likely secondary to intoxication from multiple drugs and alcohol.  Concern from nursing that the patient may need physical therapy as well.  Reevaluation patient is able to ambulate but requires some assistance.  No demonstrable focal neurologic deficits to suggest stroke or need for further imaging.  We will put in case management order along with PT consult.  I suspect the patient would likely be able to go home.  I considered admitting her for her mild anion gap is not improving however patient adamantly refuses to be admitted.   Labs, studies and imaging reviewed by myself and considered in medical decision making if ordered. Imaging interpreted by radiology.  Labs Reviewed  LACTIC ACID, PLASMA - Abnormal; Notable for the following components:      Result Value   Lactic Acid, Venous 4.3 (*)    All other components within normal limits  COMPREHENSIVE METABOLIC PANEL - Abnormal; Notable for the following components:   CO2 17 (*)    BUN 6 (*)    Anion gap 17 (*)    All other components within normal limits  ETHANOL - Abnormal; Notable for the following components:   Alcohol, Ethyl (B) 235 (*)    All other components within normal limits  RAPID URINE DRUG SCREEN, HOSP PERFORMED - Abnormal; Notable for the following components:   Cocaine POSITIVE (*)    Benzodiazepines POSITIVE (*)    Tetrahydrocannabinol POSITIVE (*)    All other components within normal limits  URINALYSIS, ROUTINE W REFLEX MICROSCOPIC -  Abnormal; Notable for the following components:   Glucose, UA 150 (*)    All other components within normal limits  LACTIC ACID, PLASMA - Abnormal; Notable for the following components:   Lactic Acid, Venous 5.3 (*)    All other components within normal limits  LACTIC ACID, PLASMA - Abnormal; Notable for the following components:   Lactic Acid, Venous 3.7 (*)    All other components within normal limits  BASIC METABOLIC PANEL - Abnormal; Notable for the following components:   CO2 15 (*)    BUN <5 (*)    Anion gap 17 (*)    All other components within normal limits  CBG MONITORING, ED - Abnormal; Notable for the following components:   Glucose-Capillary 67 (*)    All other components within normal limits  CULTURE, BLOOD (ROUTINE X 2)  CULTURE, BLOOD (ROUTINE X 2)  LIPASE, BLOOD  CBC WITH DIFFERENTIAL/PLATELET  BRAIN NATRIURETIC PEPTIDE  OSMOLALITY  LACTIC ACID, PLASMA  CBG MONITORING, ED  CBG MONITORING, ED    CT Head Wo Contrast  Final Result    CT Cervical Spine Wo Contrast  Final Result    DG Chest Port 1 View  Final Result    DG Pelvis Portable  Final Result      No follow-ups on file.    [place], Corene Cornea, MD 09/11/20 [phone]

## 2020-09-11 NOTE — ED Notes (Signed)
This RN got the pt up to ambulate - pt stated she needed to use the restroom. This RN walked pt to restroom, pt did not make it to restroom and urinated in the floor. Pt changed into clean gown.  This RN walked pt down hallway. Pt still a little unstable on feet and needs a little assistance walking.

## 2020-09-11 NOTE — ED Notes (Signed)
First contact. Change of shift. Pt resting in bed. NADN. Now a/o x 4

## 2020-09-11 NOTE — ED Notes (Signed)
[name] (Daughter) [phone]

## 2020-09-11 NOTE — ED Notes (Signed)
Pt is an ED boarder. Vitals will be collected every 8 hours.

## 2020-09-11 NOTE — Consult Note (Signed)
Medical Consultation   Noureen Stumpp  [id number]  DOB: 20-Jun-1951  DOA: 09/10/2020  PCP: Nolene Ebbs, MD (Confirm with patient/family/NH records and if not entered, this has to be entered at [facility] point of entry)  Outpatient Specialists: none ([place] speciality and name if known)   Requesting physician: Dr. Ralene Bathe  Reason for consultation: Fever, inability to ambulate   History of Present Illness: Maria Friedman is an 69 y.o. female with h/o COPD, HLD, Alcohol abuse who Christmas eve was drinking and smoking cocaine/aced THC. She became agitated, unsteady on her feet with several falls and was in her yard yelling and incoherent. She had a verbal altercation with her neighbors. EMS and police were called. EMS found her to be agitated with altered mental status. During transport to [facility] she was given versed.  In the ED she was initially unable to ambulate and was disoriented, unable to give much history. She was under the influence of alcohol and drugs. Her tox screen came back positive for cocaine and THC. CT head/neck was unremarkable, CXR - NAD, EKG w/ LVH, lab was notable for Lactic acid of 5.3 which over the next several hours came down to 1.3. Chemistry was normal except for low protein. CBC/diff-normal.  Patient improved. She was seen by PT and she was able to ambulate.  Patient was being prepared for d/c home when she spiked a fever to 100.2 and was unable to ambulate. [facility] called to evaluate the patient for possible admission.          Review of Systems:  ROS As per HPI otherwise 10 point review of systems negative.     Past Medical History: Past Medical History:  Diagnosis Date  . Allergy   . Anxiety   . Depression   . Headache(784.0)   . Hyperlipidemia   . Seasonal allergies     Past Surgical History: Past Surgical History:  Procedure Laterality Date  . FOOT FRACTURE SURGERY  1999   2 screws in foot , R  . HARDWARE REMOVAL Right  03/24/2020   Procedure: Right foot painful hardware removal distal screw and washer;  Surgeon: [name], MD;  Location: [place];  Service: Orthopedics;  Laterality: Right;  . RADIOLOGY WITH ANESTHESIA N/A 01/08/2013   Procedure: RADIOLOGY WITH ANESTHESIA: CEREBRAL ANGIOGRAM;  Surgeon: Rob Hickman, MD;  Location: [place];  Service: Radiology;  Laterality: N/A;  . TOOTH EXTRACTION     total extractions, set up for dentures   . VAGINAL DELIVERY     x3      Allergies:  No Known Allergies   Social History:   She was married for several years but divorced 20+ years ago. She has two daughters, 1 son, several grand-children. She lives alone. Her daughters live in town, her son lives in [place]. She has disability, SS, Medicaid. She lives alone. She does have a PCP.   reports that she has been smoking cigarettes. She has been smoking about 0.50 packs per day. She has never used smokeless tobacco. She reports current alcohol use of about 7.0 standard drinks of alcohol per week. She reports current drug use. Frequency: 2.00 times per week. Drugs: Marijuana and Cocaine.   Family History: Family History  Problem Relation Age of Onset  . COPD Mother   . Diabetes Other   . Thyroid disease Other   . Colon polyps Sister   . Colon cancer  Maternal Aunt   . Esophageal cancer Neg Hx   . Rectal cancer Neg Hx   . Stomach cancer Neg Hx        Physical Exam: Vitals:   09/11/20 1000 09/11/20 1100 09/11/20 1245 09/11/20 1300  BP: (!) 145/84 (!) 154/82 126/90 (!) 151/83  Pulse: 86 80  85  Resp: 16 15 17 16   Temp:    98 F (36.7 C)  TempSrc:      SpO2: 98% 98%  97%  Weight:        Constitutional: Appearance - very thin woman looking older than her years.   Alert and awake, oriented x3, not in any acute distress. Eyes: PERLA, EOMI, irises appear normal, anicteric sclera,  ENMT: external ears and nose appear normal, normal hearing             Lips appears normal,  oropharynx mucosa, tongue, edentulous with dentures Neck: neck appears normal, no masses, normal ROM, no thyromegaly, no JVD  CVS: S1-S2 clear with split S1, no murmur rubs or gallops, no LE edema, normal pedal pulses  Respiratory:  clear to auscultation bilaterally, no wheezing, rales or rhonchi. Respiratory effort normal. No accessory muscle use.  Abdomen: soft nontender, nondistended, normal bowel sounds, no hepatosplenomegaly, no hernias  Musculoskeletal: : no cyanosis, clubbing or edema noted bilaterally                        Neuro: Cranial nerves II-XII intact, strength 5/5 thru-out, sensation preserved to light touch, reflexes depressed at patellar and achilles tendon. Able to sit on the side of bed independently. Able to stand w/o assistance. Able to ambulate 30 ft with standby for balance.  Psych: judgement and insight appear normal, stable mood and affect, mental status Skin: no rashes or lesions or ulcers, no induration or nodules     Data reviewed:  I have personally reviewed following labs and imaging studies Labs:  CBC: Recent Labs  Lab 09/10/20 1447 09/11/20 0728 09/11/20 0920  WBC 10.1  --  12.2*  NEUTROABS 7.7  --  9.8*  HGB 13.3 11.9* 11.0*  HCT 40.8 35.0* 34.0*  MCV 93.4  --  90.9  PLT 302  --  [id number]    Basic Metabolic Panel: Recent Labs  Lab 09/10/20 1447 09/11/20 0059 09/11/20 0728 09/11/20 0920  NA 141 137 134* 135  K 3.7 4.8 3.7 3.8  CL 107 105  --  105  CO2 17* 15*  --  18*  GLUCOSE 72 85  --  92  BUN 6* <5*  --  <5*  CREATININE 0.54 0.53  --  0.58  CALCIUM 9.2 9.1  --  8.6*   GFR Estimated Creatinine Clearance: 44 mL/min (by C-G formula based on SCr of 0.58 mg/dL). Liver Function Tests: Recent Labs  Lab 09/10/20 1447 09/11/20 0920  AST 36 40  ALT 21 20  ALKPHOS 92 77  BILITOT 0.5 0.7  PROT 7.9 6.3*  ALBUMIN 4.5 3.4*   Recent Labs  Lab 09/10/20 1447  LIPASE 23   No results for input(s): AMMONIA in the last 168 hours. Coagulation  profile No results for input(s): INR, PROTIME in the last 168 hours.  Cardiac Enzymes: No results for input(s): CKTOTAL, CKMB, CKMBINDEX, TROPONINI in the last 168 hours. BNP: Invalid input(s): POCBNP CBG: Recent Labs  Lab 09/10/20 1449 09/10/20 1954  GLUCAP 67* 72   D-Dimer No results for input(s): DDIMER in the last 72 hours. Hgb A1c  No results for input(s): HGBA1C in the last 72 hours. Lipid Profile No results for input(s): CHOL, HDL, LDLCALC, TRIG, CHOLHDL, LDLDIRECT in the last 72 hours. Thyroid function studies No results for input(s): TSH, T4TOTAL, T3FREE, THYROIDAB in the last 72 hours.  Invalid input(s): FREET3 Anemia work up No results for input(s): VITAMINB12, FOLATE, FERRITIN, TIBC, IRON, RETICCTPCT in the last 72 hours. Urinalysis    Component Value Date/Time   COLORURINE YELLOW 09/10/2020 2004   APPEARANCEUR CLEAR 09/10/2020 2004   LABSPEC 1.012 09/10/2020 2004   PHURINE 5.0 09/10/2020 2004   GLUCOSEU 150 (A) 09/10/2020 2004   HGBUR NEGATIVE 09/10/2020 2004   BILIRUBINUR NEGATIVE 09/10/2020 2004   KETONESUR NEGATIVE 09/10/2020 2004   PROTEINUR NEGATIVE 09/10/2020 2004   UROBILINOGEN 0.2 05/20/2015 1040   NITRITE NEGATIVE 09/10/2020 2004   LEUKOCYTESUR NEGATIVE 09/10/2020 2004     Microbiology Recent Results (from the past 240 hour(s))  Resp Panel by RT-PCR (Flu A&B, Covid) Nasopharyngeal Swab     Status: None   Collection Time: 09/11/20  8:44 AM   Specimen: Nasopharyngeal Swab; Nasopharyngeal(NP) swabs in vial transport medium  Result Value Ref Range Status   SARS Coronavirus 2 by RT PCR NEGATIVE NEGATIVE Final    Comment: (NOTE) SARS-CoV-2 target nucleic acids are NOT DETECTED.  The SARS-CoV-2 RNA is generally detectable in upper respiratory specimens during the acute phase of infection. The lowest concentration of SARS-CoV-2 viral copies this assay can detect is 138 copies/mL. A negative result does not preclude SARS-Cov-2 infection and should  not be used as the sole basis for treatment or other patient management decisions. A negative result may occur with  improper specimen collection/handling, submission of specimen other than nasopharyngeal swab, presence of viral mutation(s) within the areas targeted by this assay, and inadequate number of viral copies(<138 copies/mL). A negative result must be combined with clinical observations, patient history, and epidemiological information. The expected result is Negative.  Fact Sheet for Patients:  BloggerCourse.comhttps://www.fda.gov/media/152166/download  Fact Sheet for Healthcare Providers:  SeriousBroker.ithttps://www.fda.gov/media/152162/download  This test is no t yet approved or cleared by the [place] FDA and  has been authorized for detection and/or diagnosis of SARS-CoV-2 by FDA under an Emergency Use Authorization (EUA). This EUA will remain  in effect (meaning this test can be used) for the duration of the COVID-19 declaration under Section 564(b)(1) of the Act, 21 U.S.C.section 360bbb-3(b)(1), unless the authorization is terminated  or revoked sooner.       Influenza A by PCR NEGATIVE NEGATIVE Final   Influenza B by PCR NEGATIVE NEGATIVE Final    Comment: (NOTE) The Xpert Xpress SARS-CoV-2/FLU/RSV plus assay is intended as an aid in the diagnosis of influenza from Nasopharyngeal swab specimens and should not be used as a sole basis for treatment. Nasal washings and aspirates are unacceptable for Xpert Xpress SARS-CoV-2/FLU/RSV testing.  Fact Sheet for Patients: BloggerCourse.comhttps://www.fda.gov/media/152166/download  Fact Sheet for Healthcare Providers: SeriousBroker.ithttps://www.fda.gov/media/152162/download  This test is not yet approved or cleared by the [place] FDA and has been authorized for detection and/or diagnosis of SARS-CoV-2 by FDA under an Emergency Use Authorization (EUA). This EUA will remain in effect (meaning this test can be used) for the duration of the COVID-19 declaration under  Section 564(b)(1) of the Act, 21 U.S.C. section 360bbb-3(b)(1), unless the authorization is terminated or revoked.  Performed at [facility], [address] [place], [place] [address]        Inpatient Medications:   Scheduled Meds: . diclofenac  75  mg Oral BID  . mirtazapine  30 mg Oral QHS  . simvastatin  10 mg Oral QHS   Continuous Infusions: . sodium chloride    . sodium chloride 100 mL/hr at 09/11/20 0404     Radiological Exams on Admission: CT Head Wo Contrast  Result Date: 09/10/2020 CLINICAL DATA:  Altered mental status with multiple recent falls. EXAM: CT HEAD WITHOUT CONTRAST CT CERVICAL SPINE WITHOUT CONTRAST TECHNIQUE: Multidetector CT imaging of the head and cervical spine was performed following the standard protocol without intravenous contrast. Multiplanar CT image reconstructions of the cervical spine were also generated. COMPARISON:  Head MRI 12/25/2012 and head and neck CTA 12/11/2012 FINDINGS: CT HEAD FINDINGS Brain: There is no evidence of an acute infarct, intracranial hemorrhage, mass, midline shift, or extra-axial fluid collection. Patchy and confluent hypodensities in the cerebral white matter bilaterally have progressed and are nonspecific but compatible with moderately advanced chronic small vessel ischemic disease. The ventricles and sulci are within normal limits for age. Vascular: No hyperdense vessel. Calcified atherosclerosis at the skull base. Skull: No acute fracture or suspicious osseous lesion. Remote nasal fracture. Sinuses/Orbits: Paranasal sinuses and mastoid air cells are clear. Unremarkable orbits. Other: Small right frontal scalp hematoma. CT CERVICAL SPINE FINDINGS Alignment: Normal. Skull base and vertebrae: No acute fracture or suspicious osseous lesion. Soft tissues and spinal canal: No prevertebral fluid or swelling. No visible canal hematoma. Disc levels: Mild disc space narrowing at C5-6 with suspected mild spinal stenosis at this  level due to disc bulging. Upper chest: Mild biapical pleuroparenchymal scarring. Other: Mild right carotid atherosclerosis. IMPRESSION: 1. No evidence of acute intracranial abnormality. 2. Moderately advanced chronic small vessel ischemic disease. 3. Small right frontal scalp hematoma. 4. No evidence of acute fracture or traumatic subluxation in the cervical spine. Electronically Signed   By: Sebastian Ache M.D.   On: 09/10/2020 15:19   CT Cervical Spine Wo Contrast  Result Date: 09/10/2020 CLINICAL DATA:  Altered mental status with multiple recent falls. EXAM: CT HEAD WITHOUT CONTRAST CT CERVICAL SPINE WITHOUT CONTRAST TECHNIQUE: Multidetector CT imaging of the head and cervical spine was performed following the standard protocol without intravenous contrast. Multiplanar CT image reconstructions of the cervical spine were also generated. COMPARISON:  Head MRI 12/25/2012 and head and neck CTA 12/11/2012 FINDINGS: CT HEAD FINDINGS Brain: There is no evidence of an acute infarct, intracranial hemorrhage, mass, midline shift, or extra-axial fluid collection. Patchy and confluent hypodensities in the cerebral white matter bilaterally have progressed and are nonspecific but compatible with moderately advanced chronic small vessel ischemic disease. The ventricles and sulci are within normal limits for age. Vascular: No hyperdense vessel. Calcified atherosclerosis at the skull base. Skull: No acute fracture or suspicious osseous lesion. Remote nasal fracture. Sinuses/Orbits: Paranasal sinuses and mastoid air cells are clear. Unremarkable orbits. Other: Small right frontal scalp hematoma. CT CERVICAL SPINE FINDINGS Alignment: Normal. Skull base and vertebrae: No acute fracture or suspicious osseous lesion. Soft tissues and spinal canal: No prevertebral fluid or swelling. No visible canal hematoma. Disc levels: Mild disc space narrowing at C5-6 with suspected mild spinal stenosis at this level due to disc bulging. Upper  chest: Mild biapical pleuroparenchymal scarring. Other: Mild right carotid atherosclerosis. IMPRESSION: 1. No evidence of acute intracranial abnormality. 2. Moderately advanced chronic small vessel ischemic disease. 3. Small right frontal scalp hematoma. 4. No evidence of acute fracture or traumatic subluxation in the cervical spine. Electronically Signed   By: Sebastian Ache M.D.   On: 09/10/2020 15:19  DG Pelvis Portable  Result Date: 09/10/2020 CLINICAL DATA:  Multiple falls with pelvic pain, initial encounter EXAM: PORTABLE PELVIS 1-2 VIEWS COMPARISON:  None. FINDINGS: Pelvic ring is intact. No acute fracture is noted. Diffuse vascular calcifications are seen. No soft tissue abnormality is noted. IMPRESSION: No acute abnormality noted. Electronically Signed   By: Inez Catalina M.D.   On: 09/10/2020 15:05   DG Chest Port 1 View  Result Date: 09/10/2020 CLINICAL DATA:  Multiple falls with chest pain, initial encounter EXAM: PORTABLE CHEST 1 VIEW COMPARISON:  07/17/2014 FINDINGS: Cardiac shadow is within normal limits. Aortic calcifications are noted. The lungs are well aerated bilaterally. No focal infiltrate is seen. No bony abnormality is noted. IMPRESSION: No active disease. Electronically Signed   By: Inez Catalina M.D.   On: 09/10/2020 15:05    Impression/Recommendations Active Problems:   Fever   Impaired ambulation   Alcohol use disorder, severe, dependence ([place])  1. Fever - patient's temp 98.8 on recheck by this examiner. So sign of infection. Elevated lactic acid most likely related to falls and agitation. She was given a single dose of Abx but has no indication for outpt Abx.  2. Impaired ambulation - patient was able to ambulate but is slightly unstable in regard to balance. She does have back issues for which she takes neurontine intermittently  Plan Cleared for d/c to home  Advised to have friends or family check on her today/tonigth  F/u with her PCP  3. Substance abuse - long  term issues of alcohol abuse  Plan Advised to join AA  F/u with PCP    Thank you for this consultation.     Time Spent: 11 min  Adella Hare M.D. [facility] 09/11/2020, 1:31 PM

## 2020-09-15 LAB — CULTURE, BLOOD (ROUTINE X 2)
Culture: NO GROWTH
Culture: NO GROWTH
Special Requests: ADEQUATE

## 2020-10-10 ENCOUNTER — Other Ambulatory Visit: Payer: Self-pay | Admitting: Internal Medicine

## 2020-10-11 LAB — COMPLETE METABOLIC PANEL WITH GFR
AG Ratio: 1.6 (calc) (ref 1.0–2.5)
ALT: 15 U/L (ref 6–29)
AST: 22 U/L (ref 10–35)
Albumin: 4.4 g/dL (ref 3.6–5.1)
Alkaline phosphatase (APISO): 83 U/L (ref 37–153)
BUN: 22 mg/dL (ref 7–25)
CO2: 22 mmol/L (ref 20–32)
Calcium: 9.5 mg/dL (ref 8.6–10.4)
Chloride: 99 mmol/L (ref 98–110)
Creat: 0.6 mg/dL (ref 0.50–0.99)
GFR, Est African American: 109 mL/min/{1.73_m2} (ref >=60)
GFR, Est Non African American: 94 mL/min/{1.73_m2} (ref >=60)
Globulin: 2.8 g/dL (calc) (ref 1.9–3.7)
Glucose, Bld: 52 mg/dL — ABNORMAL LOW (ref 65–99)
Potassium: 3.7 mmol/L (ref 3.5–5.3)
Sodium: 135 mmol/L (ref 135–146)
Total Bilirubin: 0.2 mg/dL (ref 0.2–1.2)
Total Protein: 7.2 g/dL (ref 6.1–8.1)

## 2020-10-11 LAB — LIPID PANEL
Cholesterol: 200 mg/dL — ABNORMAL HIGH (ref <200)
HDL: 74 mg/dL (ref >=50)
LDL Cholesterol (Calc): 107 mg/dL (calc) — ABNORMAL HIGH
Non-HDL Cholesterol (Calc): 126 mg/dL (calc) (ref <130)
Total CHOL/HDL Ratio: 2.7 (calc) (ref <5.0)
Triglycerides: 92 mg/dL (ref <150)

## 2020-10-11 LAB — CBC
HCT: 38.7 % (ref 35.0–45.0)
Hemoglobin: 13.4 g/dL (ref 11.7–15.5)
MCH: 30.4 pg (ref 27.0–33.0)
MCHC: 34.6 g/dL (ref 32.0–36.0)
MCV: 87.8 fL (ref 80.0–100.0)
MPV: 10.3 fL (ref 7.5–12.5)
Platelets: 289 10*3/uL (ref 140–400)
RBC: 4.41 10*6/uL (ref 3.80–5.10)
RDW: 13.2 % (ref 11.0–15.0)
WBC: 6.5 10*3/uL (ref 3.8–10.8)

## 2020-10-11 LAB — TSH: TSH: 0.78 mIU/L (ref 0.40–4.50)

## 2020-10-11 LAB — VITAMIN D 25 HYDROXY (VIT D DEFICIENCY, FRACTURES): Vit D, 25-Hydroxy: 91 ng/mL (ref 30–100)

## 2021-09-13 ENCOUNTER — Encounter: Payer: Self-pay | Admitting: Gastroenterology

## 2021-09-26 ENCOUNTER — Other Ambulatory Visit: Payer: Self-pay | Admitting: Family Medicine

## 2021-09-28 ENCOUNTER — Other Ambulatory Visit: Payer: Self-pay | Admitting: Family Medicine

## 2021-09-28 DIAGNOSIS — E2839 Other primary ovarian failure: Secondary | ICD-10-CM

## 2021-09-28 DIAGNOSIS — Z1231 Encounter for screening mammogram for malignant neoplasm of breast: Secondary | ICD-10-CM

## 2021-09-28 DIAGNOSIS — Z780 Asymptomatic menopausal state: Secondary | ICD-10-CM

## 2021-10-06 IMAGING — DX DG PORTABLE PELVIS
1 series · 1 of 1 positions shown · non-contrast
Comparison: None.

CLINICAL DATA: Multiple falls with pelvic pain, initial encounter

EXAM:
PORTABLE PELVIS 1-2 VIEWS

[pelvis ap]
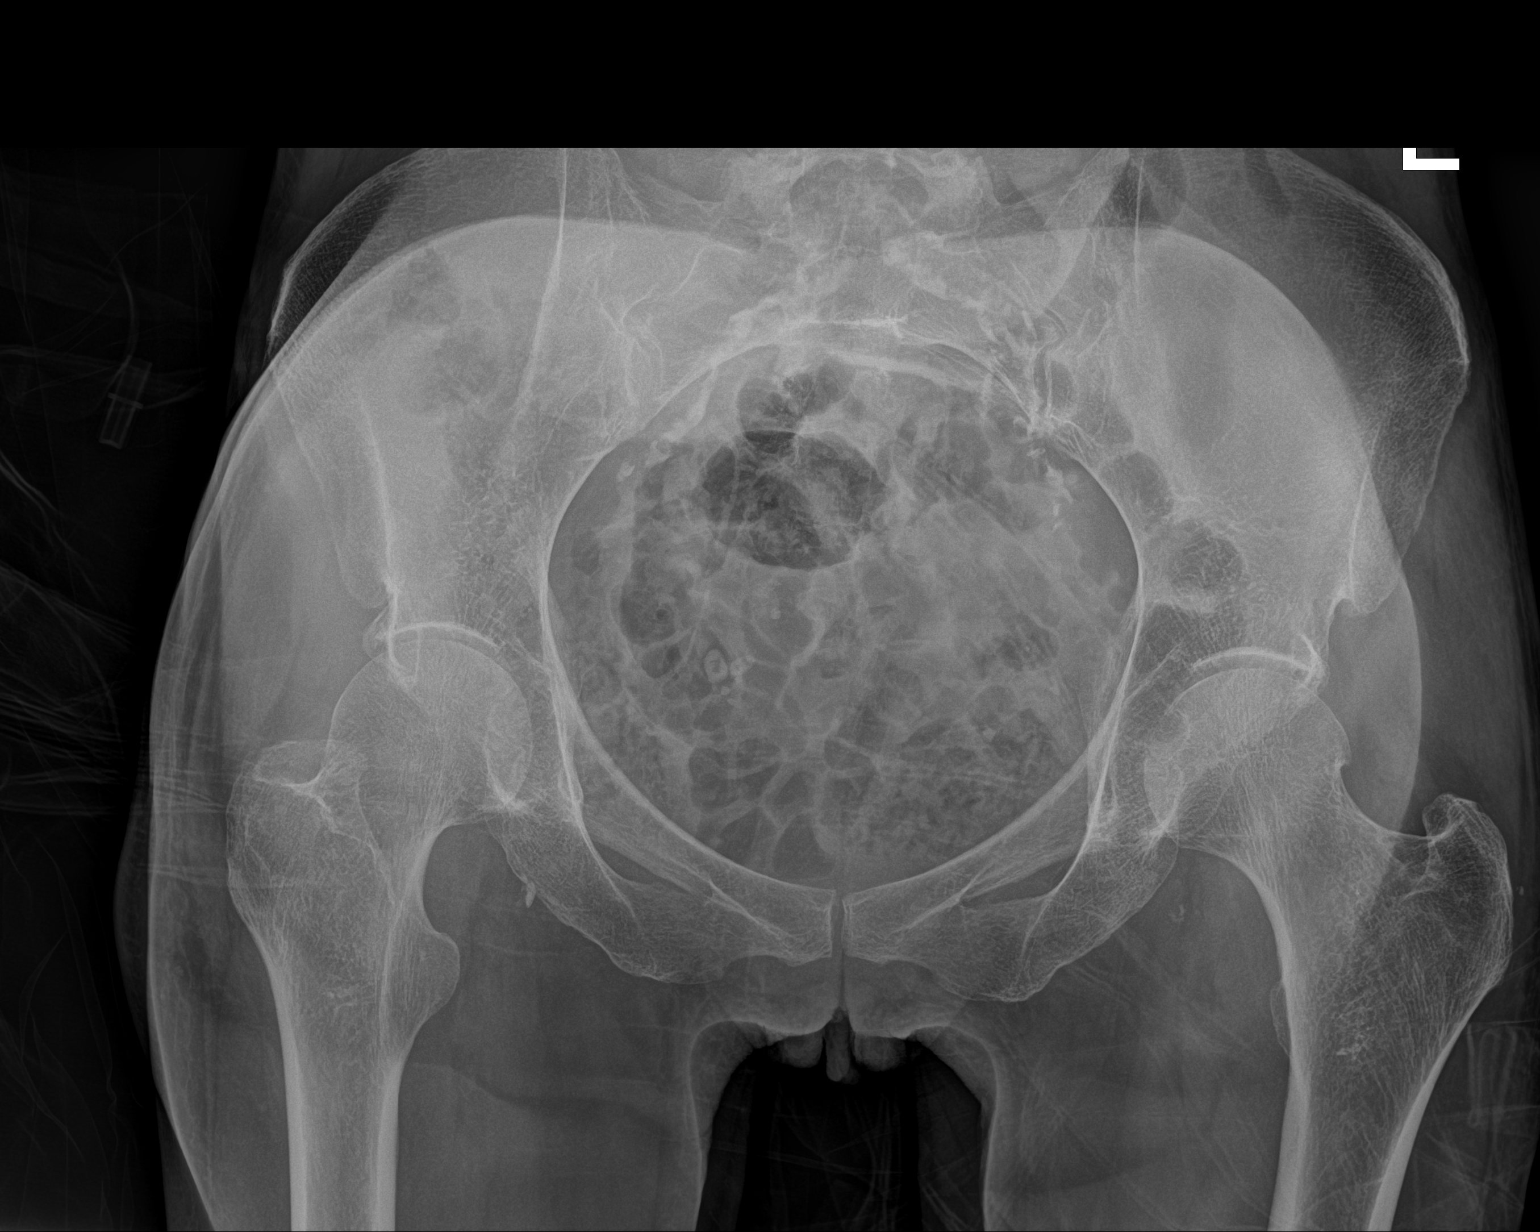

[1 of 1 positions shown; findings below may reference images not displayed]

FINDINGS: Pelvic ring is intact. No acute fracture is noted. Diffuse vascular
calcifications are seen. No soft tissue abnormality is noted.
IMPRESSION: No acute abnormality noted.

## 2022-03-06 ENCOUNTER — Other Ambulatory Visit: Payer: Self-pay | Admitting: Family Medicine

## 2022-03-06 ENCOUNTER — Ambulatory Visit
Admission: RE | Admit: 2022-03-06 | Discharge: 2022-03-06 | Disposition: A | Discharge status: Home / Self Care | Payer: Medicare Other | Source: Ambulatory Visit | Attending: Family Medicine | Admitting: Family Medicine

## 2022-03-06 DIAGNOSIS — R6884 Jaw pain: Secondary | ICD-10-CM

## 2022-03-23 ENCOUNTER — Ambulatory Visit: Payer: Medicare Other

## 2022-03-23 ENCOUNTER — Inpatient Hospital Stay: Admission: RE | Admit: 2022-03-23 | Payer: Medicare Other | Source: Ambulatory Visit

## 2022-06-01 ENCOUNTER — Other Ambulatory Visit: Payer: Self-pay | Admitting: Family Medicine

## 2022-06-01 DIAGNOSIS — E2839 Other primary ovarian failure: Secondary | ICD-10-CM

## 2022-07-02 ENCOUNTER — Encounter: Payer: Self-pay | Admitting: Gastroenterology

## 2022-07-13 ENCOUNTER — Ambulatory Visit (AMBULATORY_SURGERY_CENTER): Payer: Self-pay

## 2022-07-13 ENCOUNTER — Telehealth: Payer: Self-pay

## 2022-07-13 VITALS — Ht 64.0 in | Wt 83.0 lb

## 2022-07-13 DIAGNOSIS — Z1211 Encounter for screening for malignant neoplasm of colon: Secondary | ICD-10-CM

## 2022-07-13 MED ORDER — NA SULFATE-K SULFATE-MG SULF 17.5-3.13-1.6 GM/177ML PO SOLN: 1.0000 | ORAL | 0 refills | Status: DC

## 2022-07-13 NOTE — Telephone Encounter (Signed)
Spoke with pt and completed PV over phone.

## 2022-07-13 NOTE — Telephone Encounter (Signed)
Pt returned call at 10:48.  Will reattempt contact to conduct visit over phone at next available time slot today. Unable to begin PV 49mn late with. She verbalizes understanding and agrees with plan.

## 2022-07-13 NOTE — Telephone Encounter (Signed)
No Show PV today.  No answer at home # (voicemail full), and no answer at cell # (message left) at 10:30am.  No answer at either # 10:41.   Message to voicemail requesting call back to reschedule PV. If no call back by 5pm, PV and colonoscopy (08/01/22) will be cancelled.

## 2022-07-13 NOTE — Progress Notes (Signed)
No egg or soy allergy known to patient  No issues known to pt with past sedation with any surgeries or procedures Patient denies ever being told they had issues or difficulty with intubation  No FH of Malignant Hyperthermia Pt is not on diet pills Pt is not on  home 02  Pt is not on blood thinners  Pt denies issues with constipation  No A fib or A flutter Have any cardiac testing pending--denied Pt instructed to use Single[employer]com or GoodRx for a price reduction on prep   PV conducted over phone. Suprep instructions reviewed and mailed with sample consent to verified address and sent via [place].  Patient's chart reviewed by Osvaldo Angst CNRA prior to previsit and patient appropriate for the [facility].  Previsit completed and red dot placed by patient's name on their procedure day (on provider's schedule).

## 2022-08-01 ENCOUNTER — Encounter: Payer: Medicare Other | Admitting: Gastroenterology

## 2022-08-01 ENCOUNTER — Telehealth: Payer: Self-pay | Admitting: Gastroenterology

## 2022-08-01 NOTE — Telephone Encounter (Signed)
Sorry to hear this.Thank you for letting me know. Maria Friedman can you please process for no-show fee. She can reschedule at her convenience.

## 2022-08-01 NOTE — Telephone Encounter (Signed)
Good Morning Dr.Armbruster,  Patient called in this morning to reschedule her colonoscopy because she had a doctors appointment yesterday and forgot to take her prep-medication.

## 2022-08-18 ENCOUNTER — Encounter: Payer: Medicare Other | Admitting: Gastroenterology

## 2022-08-18 ENCOUNTER — Telehealth: Payer: Self-pay | Admitting: Gastroenterology

## 2022-08-18 NOTE — Telephone Encounter (Signed)
Thanks for letting me know. This is her second straight no show for colonoscopy. She should be charged a no-show fee. Maria Friedman if you can process. She will need another pre-visit in order to be ordered to schedule again for a colonscopy, and if she missed the next appointment / no shows she will be discharged from the practice.

## 2022-08-18 NOTE — Telephone Encounter (Signed)
Good Morning Dr. Havery Moros,  I called this patient at 10:15 am today to see if she was coming for her procedure today.   I did not get anyone on the phone so I left a message for her to call if she was running late or if she needed to reschedule.  I will NO SHOW her.  [employer]

## 2023-04-02 ENCOUNTER — Encounter (HOSPITAL_BASED_OUTPATIENT_CLINIC_OR_DEPARTMENT_OTHER): Payer: Self-pay | Admitting: Pulmonary Disease

## 2023-04-02 ENCOUNTER — Ambulatory Visit (INDEPENDENT_AMBULATORY_CARE_PROVIDER_SITE_OTHER): Payer: 59 | Admitting: Pulmonary Disease

## 2023-04-02 ENCOUNTER — Institutional Professional Consult (permissible substitution) (HOSPITAL_BASED_OUTPATIENT_CLINIC_OR_DEPARTMENT_OTHER): Payer: 59 | Admitting: Pulmonary Disease

## 2023-04-02 ENCOUNTER — Other Ambulatory Visit (HOSPITAL_COMMUNITY): Payer: Self-pay

## 2023-04-02 VITALS — BP 142/62 | HR 67

## 2023-04-02 DIAGNOSIS — Z720 Tobacco use: Secondary | ICD-10-CM

## 2023-04-02 DIAGNOSIS — J42 Unspecified chronic bronchitis: Secondary | ICD-10-CM | POA: Diagnosis not present

## 2023-04-02 LAB — PULMONARY FUNCTION TEST
FEF 25-75 Pre: 0.99 L/sec
FEF2575-%Pred-Pre: 52 %
FEV1-%Pred-Pre: 70 %
FEV1-Pre: 1.59 L
FEV1FVC-%Pred-Pre: 88 %
FEV6-%Pred-Pre: 82 %
FEV6-Pre: 2.37 L
FEV6FVC-%Pred-Pre: 104 %
FVC-%Pred-Pre: 79 %
FVC-Pre: 2.37 L
Pre FEV1/FVC ratio: 67 %
Pre FEV6/FVC Ratio: 100 %

## 2023-04-02 MED ORDER — AZITHROMYCIN 250 MG PO TABS: ORAL_TABLET | ORAL | 0 refills | Status: DC

## 2023-04-02 MED ORDER — ANORO ELLIPTA 62.5-25 MCG/ACT IN AEPB: 1.0000 | INHALATION_SPRAY | Freq: Every day | RESPIRATORY_TRACT | 5 refills | Status: DC

## 2023-04-02 NOTE — Patient Instructions (Signed)
Acute on chronic bronchitis --Pulmonary function test performed in-clinic today --Reviewed PFTs. Normal but borderline obstructive defect --START Anoro ONE puff ONCE a day. This is your everyday medication --START azithromycin for five days

## 2023-04-02 NOTE — Patient Instructions (Signed)
Pre/Post completed today.

## 2023-04-02 NOTE — Progress Notes (Signed)
Subjective:   PATIENT ID: Maria Friedman GENDER: female DOB: Jun 07, 1951, MRN: [id number]  Chief Complaint  Patient presents with   Consult    Evaluation for COPD    Reason for Visit: New consult for COPD  [name] is a 72 year old female active smoker with alcohol use, COPD, HLD, hx CVA who presents for COPD evaluation.  Her PCP referred her to Pulmonary for COPD evaluation and management. She reports smoking 1 ppd x 30 year. Currently she is smoking 1/2 ppd especially if she is drinking or watching TV. Mainly smokes to socialize. Lives by herself in apartment. She drinks 1-2 beer daily. Her parents had COPD. Her children don't smoke. She reports baseline shortness of breath with moderate exertion especially in the heat. She has chronic cough with productive cough with occasional wheezing. Denies frequent infections. No limitation in activity. Has congestion.  Social History: Active smoker 1/2 ppd  I have personally reviewed patient's past medical/family/social history, allergies, current medications.  Past Medical History:  Diagnosis Date   Allergy    Anxiety    Depression    Headache(784.0)    Hyperlipidemia    Seasonal allergies      Family History  Problem Relation Age of Onset   COPD Mother    Colon polyps Sister    Colon cancer Brother    Colon cancer Maternal Aunt    Diabetes Other    Thyroid disease Other    Esophageal cancer Neg Hx    Rectal cancer Neg Hx    Stomach cancer Neg Hx      Social History   Occupational History   Not on file  Tobacco Use   Smoking status: Every Day    Current packs/day: 0.50    Types: Cigarettes   Smokeless tobacco: Never  Substance and Sexual Activity   Alcohol use: Yes    Alcohol/week: 7.0 standard drinks of alcohol    Types: 7 Cans of beer per week    Comment: per day   Drug use: Not Currently    Frequency: 2.0 times per week    Types: Marijuana, Cocaine    Comment: used 6/23--crack and daily marijuana    Sexual activity: Not on file    No Known Allergies   Outpatient Medications Prior to Visit  Medication Sig Dispense Refill   albuterol (VENTOLIN HFA) 108 (90 Base) MCG/ACT inhaler Inhale 2 puffs into the lungs every 6 (six) hours as needed for wheezing or shortness of breath.     diclofenac (VOLTAREN) 75 MG EC tablet Take 75 mg by mouth 2 (two) times daily.     gabapentin (NEURONTIN) 300 MG capsule Take 300 mg by mouth 2 (two) times daily.  5   HYDROcodone-acetaminophen (NORCO/VICODIN) 5-325 MG tablet Take 1 tablet by mouth 2 (two) times daily as needed. (Patient not taking: Reported on 07/13/2022)  0   mirtazapine (REMERON) 30 MG tablet Take 30 mg by mouth at bedtime.     mometasone (NASONEX) 50 MCG/ACT nasal spray Place 2 sprays into the nose daily. (Patient not taking: Reported on 07/13/2022)     Na Sulfate-K Sulfate-Mg Sulf 17.5-3.13-1.6 GM/177ML SOLN Take 1 kit by mouth as directed. May use generic Suprep, no prior authorization. Take as directed. 354 mL 0   simvastatin (ZOCOR) 10 MG tablet Take 10 mg by mouth at bedtime.  5   triamcinolone cream (KENALOG) 0.5 % Apply 1 application topically 2 (two) times daily as needed for rash. (Patient not  taking: Reported on 07/13/2022)     Facility-Administered Medications Prior to Visit  Medication Dose Route Frequency Provider Last Rate Last Admin   0.9 %  sodium chloride infusion  500 mL Intravenous Continuous Armbruster, Willaim Rayas, MD        Review of Systems  Constitutional:  Negative for chills, diaphoresis, fever, malaise/fatigue and weight loss.  HENT:  Negative for congestion.   Respiratory:  Positive for cough, sputum production, shortness of breath and wheezing. Negative for hemoptysis.   Cardiovascular:  Negative for chest pain, palpitations and leg swelling.     Objective:   Vitals:   04/02/23 0844  BP: (!) 142/62  Pulse: 67  SpO2: 98%   SpO2: 98 %  Physical Exam: General: Thin-appearing, no acute distress HENT: [place],  AT Eyes: EOMI, no scleral icterus Respiratory: Clear to auscultation bilaterally.  No crackles, wheezing or rales Cardiovascular: RRR, -M/R/G, no JVD Extremities:-Edema,-tenderness Neuro: AAO x4, CNII-XII grossly intact Psych: Normal mood, normal affect  Data Reviewed:  Imaging: CXR 09/10/20 - No infiltrate effusion or edema  PFT: None on file  Labs: CBC    Component Value Date/Time   WBC 6.5 10/10/2020 0947   RBC 4.41 10/10/2020 0947   HGB 13.4 10/10/2020 0947   HCT 38.7 10/10/2020 0947   PLT 289 10/10/2020 0947   MCV 87.8 10/10/2020 0947   MCH 30.4 10/10/2020 0947   MCHC 34.6 10/10/2020 0947   RDW 13.2 10/10/2020 0947   LYMPHSABS 1.4 09/11/2020 0920   MONOABS 0.9 09/11/2020 0920   EOSABS 0.0 09/11/2020 0920   BASOSABS 0.0 09/11/2020 0920   Absolute eos 09/11/20 - 0       Assessment & Plan:   Discussion: 72 year old female active smoker with alcohol use, COPD, HLD, hx CVA who presents for COPD evaluation. Discussed clinical course and management of COPD/asthma including bronchodilator regimen, preventive care and action plan for exacerbation. Reviewed PFTs which fortunately do not show COPD but she is borderline and symptomatic so would be reasonable to trial bronchodilators for her chronic bronchitis.  Acute on chronic bronchitis --Pulmonary function test performed in-clinic today --Reviewed PFTs. Normal but borderline obstructive defect --START Anoro ONE puff ONCE a day. This is your everyday medication --START azithromycin for five days  Tobacco abuse Alcohol use Patient is an active smoker. Did not tolerate wellbutrin We discussed smoking cessation for 10 minutes. We discussed triggers and stressors and ways to deal with them. We discussed barriers to continued smoking and benefits of smoking cessation. Provided patient with information cessation techniques and interventions including [place] quitline. --Recommend nicotine patches --Consider volunteering at  nursing home --Consider AA --Enroll in lung cancer screening program   Health Maintenance Immunization History  Administered Date(s) Administered   PFIZER(Purple Top)SARS-COV-2 Vaccination 11/19/2019, 12/22/2019   CT Lung Screen - enroll  Orders Placed This Encounter  Procedures   Ambulatory Referral for Lung Cancer Scre    Referral Priority:   Routine    Referral Type:   Consultation    Referral Reason:   Specialty Services Required    Number of Visits Requested:   1   Pulmonary function test    Order Specific Question:   Where should this test be performed?    Answer:   [facility]    Order Specific Question:   Full PFT: includes the following: basic spirometry, spirometry pre & post bronchodilator, diffusion capacity (DLCO), lung volumes    Answer:   Full PFT   Meds ordered this encounter  Medications   azithromycin (ZITHROMAX) 250 MG tablet    Sig: Take two tablets on day 1, then one tablet daily on day 2-5.    Dispense:  6 tablet    Refill:  0   umeclidinium-vilanterol (ANORO ELLIPTA) 62.5-25 MCG/ACT AEPB    Sig: Inhale 1 puff into the lungs daily.    Dispense:  60 each    Refill:  5    Return in about 3 months (around 07/03/2023).  I have spent a total time of 60-minutes on the day of the appointment reviewing prior documentation, coordinating care and discussing medical diagnosis and plan with the patient/family. Imaging, labs and tests included in this note have been reviewed and interpreted independently by me.  Maria Geyer Mechele Collin, MD [facility] 04/02/2023 10:25 AM  Office Number [phone]

## 2023-04-02 NOTE — Progress Notes (Signed)
Pre/Post completed today.

## 2023-09-30 ENCOUNTER — Other Ambulatory Visit (HOSPITAL_BASED_OUTPATIENT_CLINIC_OR_DEPARTMENT_OTHER): Payer: Self-pay | Admitting: Pulmonary Disease

## 2023-11-13 ENCOUNTER — Encounter: Payer: Self-pay | Admitting: Gastroenterology

## 2023-11-22 ENCOUNTER — Other Ambulatory Visit (HOSPITAL_BASED_OUTPATIENT_CLINIC_OR_DEPARTMENT_OTHER): Payer: Self-pay | Admitting: Pulmonary Disease

## 2023-11-25 ENCOUNTER — Ambulatory Visit (AMBULATORY_SURGERY_CENTER): Payer: 59

## 2023-11-25 VITALS — Ht 64.0 in | Wt 90.0 lb

## 2023-11-25 DIAGNOSIS — Z1211 Encounter for screening for malignant neoplasm of colon: Secondary | ICD-10-CM

## 2023-11-25 MED ORDER — NA SULFATE-K SULFATE-MG SULF 17.5-3.13-1.6 GM/177ML PO SOLN: 1.0000 | Freq: Once | ORAL | 0 refills | Status: AC

## 2023-11-25 NOTE — Progress Notes (Signed)

## 2023-12-22 ENCOUNTER — Encounter: Payer: Self-pay | Admitting: Certified Registered Nurse Anesthetist

## 2023-12-26 ENCOUNTER — Encounter: Payer: Self-pay | Admitting: Gastroenterology

## 2023-12-26 ENCOUNTER — Ambulatory Visit: Payer: 59 | Admitting: Gastroenterology

## 2023-12-26 VITALS — BP 117/69 | HR 70 | Temp 97.9°F | Resp 15 | Ht 64.0 in | Wt 90.0 lb

## 2023-12-26 DIAGNOSIS — Z1211 Encounter for screening for malignant neoplasm of colon: Secondary | ICD-10-CM

## 2023-12-26 DIAGNOSIS — K5730 Diverticulosis of large intestine without perforation or abscess without bleeding: Secondary | ICD-10-CM

## 2023-12-26 DIAGNOSIS — K635 Polyp of colon: Secondary | ICD-10-CM | POA: Diagnosis not present

## 2023-12-26 DIAGNOSIS — Z860100 Personal history of colon polyps, unspecified: Secondary | ICD-10-CM

## 2023-12-26 DIAGNOSIS — D125 Benign neoplasm of sigmoid colon: Secondary | ICD-10-CM

## 2023-12-26 DIAGNOSIS — Z860102 Personal history of hyperplastic colon polyps: Secondary | ICD-10-CM

## 2023-12-26 DIAGNOSIS — Z800 Family history of malignant neoplasm of digestive organs: Secondary | ICD-10-CM

## 2023-12-26 DIAGNOSIS — Q439 Congenital malformation of intestine, unspecified: Secondary | ICD-10-CM | POA: Diagnosis not present

## 2023-12-26 MED ORDER — SODIUM CHLORIDE 0.9 % IV SOLN
500.0000 mL | Freq: Once | INTRAVENOUS | Status: DC
Start: 2023-12-26 — End: 2023-12-26

## 2023-12-26 NOTE — Progress Notes (Signed)
 Called to room to assist during endoscopic procedure.  Patient ID and intended procedure confirmed with present staff. Received instructions for my participation in the procedure from the performing physician.

## 2023-12-26 NOTE — Patient Instructions (Signed)
 Resume previous diet and medications.  Repeat colonoscopy not recommended at this time.  Handouts provided on polyps and diverticulosis.    YOU HAD AN ENDOSCOPIC PROCEDURE TODAY AT THE [facility]:   Refer to the procedure report that was given to you for any specific questions about what was found during the examination.  If the procedure report does not answer your questions, please call your gastroenterologist to clarify.  If you requested that your care partner not be given the details of your procedure findings, then the procedure report has been included in a sealed envelope for you to review at your convenience later.  YOU SHOULD EXPECT: Some feelings of bloating in the abdomen. Passage of more gas than usual.  Walking can help get rid of the air that was put into your GI tract during the procedure and reduce the bloating. If you had a lower endoscopy (such as a colonoscopy or flexible sigmoidoscopy) you may notice spotting of blood in your stool or on the toilet paper. If you underwent a bowel prep for your procedure, you may not have a normal bowel movement for a few days.  Please Note:  You might notice some irritation and congestion in your nose or some drainage.  This is from the oxygen used during your procedure.  There is no need for concern and it should clear up in a day or so.  SYMPTOMS TO REPORT IMMEDIATELY:  Following lower endoscopy (colonoscopy or flexible sigmoidoscopy):  Excessive amounts of blood in the stool  Significant tenderness or worsening of abdominal pains  Swelling of the abdomen that is new, acute  Fever of 100F or higher  For urgent or emergent issues, a gastroenterologist can be reached at any hour by calling [phone]. Do not use MyChart messaging for urgent concerns.    DIET:  We do recommend a small meal at first, but then you may proceed to your regular diet.  Drink plenty of fluids but you should avoid alcoholic beverages for 24  hours.  ACTIVITY:  You should plan to take it easy for the rest of today and you should NOT DRIVE or use heavy machinery until tomorrow (because of the sedation medicines used during the test).    FOLLOW UP: Our staff will call the number listed on your records the next business day following your procedure.  We will call around 7:15- 8:00 am to check on you and address any questions or concerns that you may have regarding the information given to you following your procedure. If we do not reach you, we will leave a message.     If any biopsies were taken you will be contacted by phone or by letter within the next 1-3 weeks.  Please call us at [phone] if you have not heard about the biopsies in 3 weeks.    SIGNATURES/CONFIDENTIALITY: You and/or your care partner have signed paperwork which will be entered into your electronic medical record.  These signatures attest to the fact that that the information above on your After Visit Summary has been reviewed and is understood.  Full responsibility of the confidentiality of this discharge information lies with you and/or your care-partner.

## 2023-12-26 NOTE — Op Note (Signed)
 [facility] Patient Name: Maria Friedman Procedure Date: 12/26/2023 1:32 PM MRN: [id number] Endoscopist: Viviann Spare P. Adela Lank , MD, [id number] Age: 73 Referring MD:  Date of Birth: 08-31-1951 Gender: Female Account #: [id number] Procedure:                Colonoscopy Indications:              High risk colon cancer surveillance: Personal                            history of colonic polyps - right sided                            "hyperplastic polyps" removed 2017, concern for                            sessile serrated polyps. Brother with colon cancer                            dx age 1s. Of note, patient weighs 90 lbs,                            ultraslim pediatric colonoscope used for this exam. Medicines:                Monitored Anesthesia Care Procedure:                Pre-Anesthesia Assessment:                           - Prior to the procedure, a History and Physical                            was performed, and patient medications and                            allergies were reviewed. The patient's tolerance of                            previous anesthesia was also reviewed. The risks                            and benefits of the procedure and the sedation                            options and risks were discussed with the patient.                            All questions were answered, and informed consent                            was obtained. Prior Anticoagulants: The patient has                            taken no anticoagulant or antiplatelet agents. ASA  Grade Assessment: II - A patient with mild systemic                            disease. After reviewing the risks and benefits,                            the patient was deemed in satisfactory condition to                            undergo the procedure.                           After obtaining informed consent, the colonoscope                            was passed under  direct vision. Throughout the                            procedure, the patient's blood pressure, pulse, and                            oxygen saturations were monitored continuously. The                            PCF-[id number] Slim SN [id number] was introduced through                            the anus and advanced to the the cecum, identified                            by appendiceal orifice and ileocecal valve. The                            colonoscopy was technically difficult and complex                            due to a tortuous colon. The patient tolerated the                            procedure well. The quality of the bowel                            preparation was adequate (with exception of the                            cecum). The ileocecal valve, appendiceal orifice,                            and rectum were photographed. Scope In: 1:38:09 PM Scope Out: 2:04:33 PM Scope Withdrawal Time: 0 hours 11 minutes 10 seconds  Total Procedure Duration: 0 hours 26 minutes 24 seconds  Findings:                 The perianal and digital rectal examinations were  normal.                           Scattered small-mouthed diverticula were found in                            the transverse colon and left colon.                           The left colon had restricted mobility with                            diverticulosis. The colon was extremely tortuous.                            Technically difficult cecal intubation with                            restricted mobility.                           A 3 mm polyp was found in the sigmoid colon. The                            polyp was sessile. The polyp was removed with a                            cold snare. Resection and retrieval were complete.                           The cecal cap had some residual stool that was hard                            to clear. AO was not well seen, no obvious polyps                             or mass lesions noted in the cecum. The exam was                            otherwise without abnormality. Complications:            No immediate complications. Estimated blood loss:                            Minimal. Estimated Blood Loss:     Estimated blood loss was minimal. Impression:               - Diverticulosis in the transverse colon and in the                            left colon.                           - Extremely tortuous and restricted colon making  for technically challenging cecal intubation.                           - One 3 mm polyp in the sigmoid colon, removed with                            a cold snare. Resected and retrieved.                           - The examination was otherwise normal. Difficult                            to clear cecal cap due to residual stool there. Recommendation:           - Patient has a contact number available for                            emergencies. The signs and symptoms of potential                            delayed complications were discussed with the                            patient. Return to normal activities tomorrow.                            Written discharge instructions were provided to the                            patient.                           - Resume previous diet.                           - Continue present medications.                           - Await pathology results.                           - Given technically difficulty of this exam, would                            not recommend further surveillance colonoscopy                            moving forward, higher risk for colonic injury                            despite using ultraslim pediatric colonoscope Kuzey Ogata P. Lydiana Milley, MD 12/26/2023 2:12:17 PM This report has been signed electronically.

## 2023-12-26 NOTE — Progress Notes (Signed)
 [facility] History and Physical   Primary Care Physician:  Ellyn Hack, MD   Reason for Procedure:   History of colon polyps, family history of colon cancer  Plan:    colonoscopy     HPI: Maria Friedman is a 73 y.o. female  here for colonoscopy surveillance - 4 polyps removed 08/2016 - flat ascending polyps returned as "hyperplastic" suspicious for SSPs..    Patient denies any bowel symptoms at this time. Brother had CRC sounds like age 40s. Otherwise feels well without any cardiopulmonary symptoms.   I have discussed risks / benefits of anesthesia and endoscopic procedure with Neva Seat and they wish to proceed with the exams as outlined today.    Past Medical History:  Diagnosis Date   Allergy    Anxiety    Depression    Headache(784.0)    Hyperlipidemia    Seasonal allergies     Past Surgical History:  Procedure Laterality Date   FOOT FRACTURE SURGERY  1999   2 screws in foot , R   HARDWARE REMOVAL Right 03/24/2020   Procedure: Right foot painful hardware removal distal screw and washer;  Surgeon: Toni Arthurs, MD;  Location: [facility];  Service: Orthopedics;  Laterality: Right;   RADIOLOGY WITH ANESTHESIA N/A 01/08/2013   Procedure: RADIOLOGY WITH ANESTHESIA: CEREBRAL ANGIOGRAM;  Surgeon: Oneal Grout, MD;  Location: [facility];  Service: Radiology;  Laterality: N/A;   TOOTH EXTRACTION     total extractions, set up for dentures    VAGINAL DELIVERY     x3     Prior to Admission medications   Medication Sig Start Date End Date Taking? Authorizing Provider  rosuvastatin (CRESTOR) 40 MG tablet Take 40 mg by mouth daily. 11/22/23  Yes [provider]  simvastatin (ZOCOR) 10 MG tablet Take 10 mg by mouth at bedtime. 07/06/16  Yes [provider]  Vitamin D, Ergocalciferol, (DRISDOL) 1.25 MG (50000 UNIT) CAPS capsule SMARTSIG:1 pill By Mouth Once a Week 12/12/23  Yes [provider]  albuterol (VENTOLIN HFA)  108 (90 Base) MCG/ACT inhaler Inhale 2 puffs into the lungs every 6 (six) hours as needed for wheezing or shortness of breath.    [provider]  Ernestina Patches 62.5-25 MCG/ACT AEPB Inhale 1 puff into the lungs daily. Patient not taking: Reported on 12/26/2023 11/24/23   Luciano Cutter, MD  diclofenac (VOLTAREN) 75 MG EC tablet Take 75 mg by mouth 2 (two) times daily. Patient not taking: Reported on 11/25/2023    [provider]  gabapentin (NEURONTIN) 300 MG capsule Take 300 mg by mouth 2 (two) times daily. Patient not taking: Reported on 11/25/2023 07/06/16   [provider]  HYDROcodone-acetaminophen (NORCO/VICODIN) 5-325 MG tablet Take 1 tablet by mouth 2 (two) times daily as needed. Patient not taking: Reported on 07/13/2022 08/06/16   [provider]  mirtazapine (REMERON) 30 MG tablet Take 30 mg by mouth at bedtime. Patient not taking: Reported on 11/25/2023 08/09/20   [provider]  mometasone (NASONEX) 50 MCG/ACT nasal spray Place 2 sprays into the nose daily. Patient not taking: Reported on 07/13/2022    [provider]  pregabalin (LYRICA) 75 MG capsule Take 75 mg by mouth 2 (two) times daily as needed. 08/07/23   [provider]  triamcinolone cream (KENALOG) 0.5 % Apply 1 application topically 2 (two) times daily as needed for rash. Patient not taking: Reported on 07/13/2022 08/02/20   [provider]  Current Outpatient Medications  Medication Sig Dispense Refill   rosuvastatin (CRESTOR) 40 MG tablet Take 40 mg by mouth daily.     simvastatin (ZOCOR) 10 MG tablet Take 10 mg by mouth at bedtime.  5   Vitamin D, Ergocalciferol, (DRISDOL) 1.25 MG (50000 UNIT) CAPS capsule SMARTSIG:1 pill By Mouth Once a Week     albuterol (VENTOLIN HFA) 108 (90 Base) MCG/ACT inhaler Inhale 2 puffs into the lungs every 6 (six) hours as needed for wheezing or shortness of breath.     ANORO ELLIPTA 62.5-25 MCG/ACT AEPB Inhale 1  puff into the lungs daily. (Patient not taking: Reported on 12/26/2023) 60 each 5   diclofenac (VOLTAREN) 75 MG EC tablet Take 75 mg by mouth 2 (two) times daily. (Patient not taking: Reported on 11/25/2023)     gabapentin (NEURONTIN) 300 MG capsule Take 300 mg by mouth 2 (two) times daily. (Patient not taking: Reported on 11/25/2023)  5   HYDROcodone-acetaminophen (NORCO/VICODIN) 5-325 MG tablet Take 1 tablet by mouth 2 (two) times daily as needed. (Patient not taking: Reported on 07/13/2022)  0   mirtazapine (REMERON) 30 MG tablet Take 30 mg by mouth at bedtime. (Patient not taking: Reported on 11/25/2023)     mometasone (NASONEX) 50 MCG/ACT nasal spray Place 2 sprays into the nose daily. (Patient not taking: Reported on 07/13/2022)     pregabalin (LYRICA) 75 MG capsule Take 75 mg by mouth 2 (two) times daily as needed.     triamcinolone cream (KENALOG) 0.5 % Apply 1 application topically 2 (two) times daily as needed for rash. (Patient not taking: Reported on 07/13/2022)     Current Facility-Administered Medications  Medication Dose Route Frequency Provider Last Rate Last Admin   0.9 %  sodium chloride infusion  500 mL Intravenous Continuous Vanessa Alesi, Willaim Rayas, MD       0.9 %  sodium chloride infusion  500 mL Intravenous Once Shavonn Convey, Willaim Rayas, MD        Allergies as of 12/26/2023   (No Known Allergies)    Family History  Problem Relation Age of Onset   COPD Mother    Colon polyps Sister    Colon cancer Brother    Colon cancer Maternal Aunt    Diabetes Other    Thyroid disease Other    Esophageal cancer Neg Hx    Rectal cancer Neg Hx    Stomach cancer Neg Hx     Social History   Socioeconomic History   Marital status: Single    Spouse name: Not on file   Number of children: Not on file   Years of education: Not on file   Highest education level: Not on file  Occupational History   Not on file  Tobacco Use   Smoking status: Every Day    Current packs/day: 0.50     Types: Cigarettes   Smokeless tobacco: Never  Substance and Sexual Activity   Alcohol use: Yes    Alcohol/week: 7.0 standard drinks of alcohol    Types: 7 Cans of beer per week    Comment: per day   Drug use: Not Currently    Frequency: 2.0 times per week    Types: Marijuana, Cocaine    Comment: used 6/23--crack and daily marijuana   Sexual activity: Not on file  Other Topics Concern   Not on file  Social History Narrative   Not on file   Social Drivers of Health   Financial Resource Strain: Not on file  Food Insecurity: Not on file  Transportation Needs: Not on file  Physical Activity: Not on file  Stress: Not on file  Social Connections: Not on file  Intimate Partner Violence: Not on file    Review of Systems: All other review of systems negative except as mentioned in the HPI.  Physical Exam: Vital signs BP 126/77   Pulse 84   Temp 97.9 F (36.6 C) (Temporal)   Ht 5\' 4"  (1.626 m)   Wt 90 lb (40.8 kg)   SpO2 99%   BMI 15.45 kg/m   General:   Alert,  Well-developed, pleasant and cooperative in NAD Lungs:  Clear throughout to auscultation.   Heart:  Regular rate and rhythm Abdomen:  Soft, nontender and nondistended.   Neuro/Psych:  Alert and cooperative. Normal mood and affect. A and O x 3  Harlin Rain, MD [facility]

## 2023-12-26 NOTE — Progress Notes (Signed)
 Report given to PACU, vss

## 2023-12-26 NOTE — Progress Notes (Signed)
 Pt's states no medical or surgical changes since previsit or office visit.

## 2023-12-27 ENCOUNTER — Telehealth: Payer: Self-pay

## 2023-12-27 NOTE — Telephone Encounter (Signed)
 No answer after follow up call. Unable to leave voice message due to patients voicemail being full.

## 2023-12-31 LAB — SURGICAL PATHOLOGY

## 2024-01-03 ENCOUNTER — Encounter: Payer: Self-pay | Admitting: Gastroenterology

## 2024-07-21 ENCOUNTER — Ambulatory Visit
Admission: RE | Admit: 2024-07-21 | Discharge: 2024-07-21 | Disposition: A | Source: Ambulatory Visit | Attending: Family Medicine | Admitting: Family Medicine

## 2024-07-21 ENCOUNTER — Other Ambulatory Visit: Payer: Self-pay | Admitting: Family Medicine

## 2024-07-21 DIAGNOSIS — M79642 Pain in left hand: Secondary | ICD-10-CM

## 2024-07-21 DIAGNOSIS — M79672 Pain in left foot: Secondary | ICD-10-CM

## 2024-09-23 ENCOUNTER — Other Ambulatory Visit (HOSPITAL_BASED_OUTPATIENT_CLINIC_OR_DEPARTMENT_OTHER): Payer: Self-pay | Admitting: Pulmonary Disease
